# Patient Record
Sex: Female | Born: 1970 | Race: Black or African American | Hispanic: No | Marital: Married | Smoking: Current every day smoker
Health system: Southern US, Community
[De-identification: ages and names within clinical notes are randomized; demographics above are authoritative.]

---

## 2006-04-23 ENCOUNTER — Emergency Department (HOSPITAL_COMMUNITY): Admission: EM | Admit: 2006-04-23 | Discharge: 2006-04-23 | Payer: Self-pay | Admitting: Emergency Medicine

## 2008-06-21 IMAGING — US US PELVIS COMPLETE MODIFY
1 series · 14 of 25 positions shown · non-contrast
Comparison: None.

CLINICAL DATA: Abortion 2 days ago.  Abdominal pain.  Evaluate for retained products of conception. 
 TRANSABDOMINAL AND TRANSVAGINAL PELVIC ULTRASOUND:
TECHNIQUE: Both transabdominal and transvaginal ultrasound examinations of the pelvis were performed including evaluation of the uterus, ovaries, adnexal regions, and pelvic cul-de-sac.

[Series 1: unknown · 0.27mm/px · 14 of 53 slices shown]
[im 1/53]
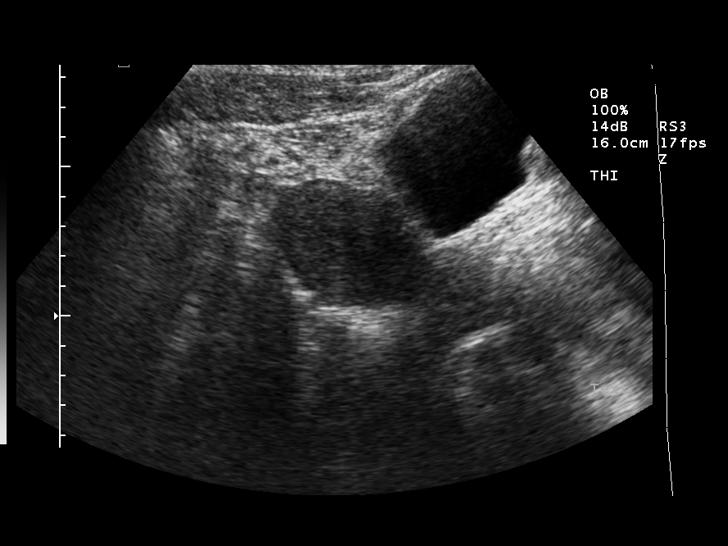
[im 5/53]
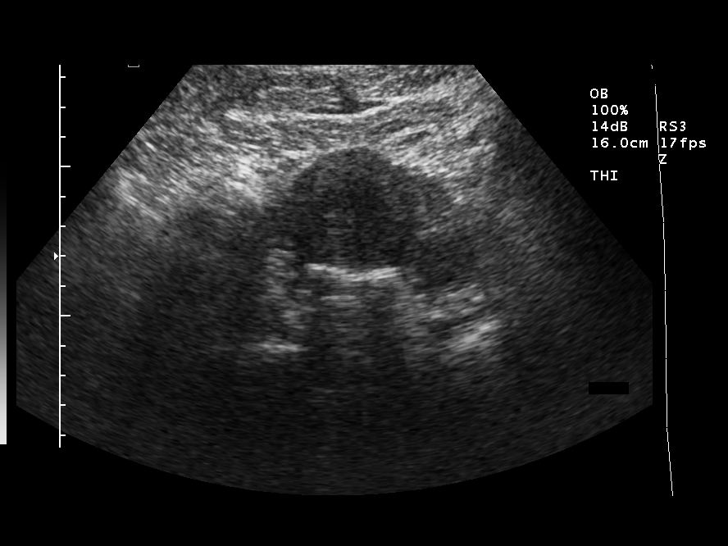
[im 9/53]
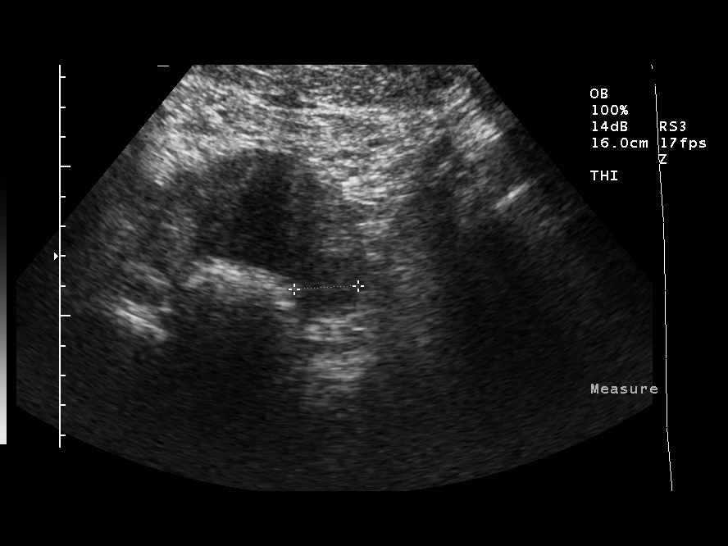
[im 14/53]
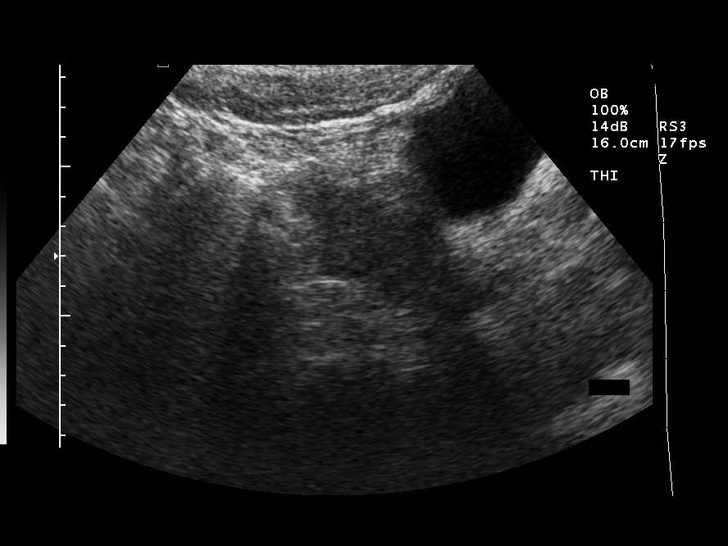
[im 18/53]
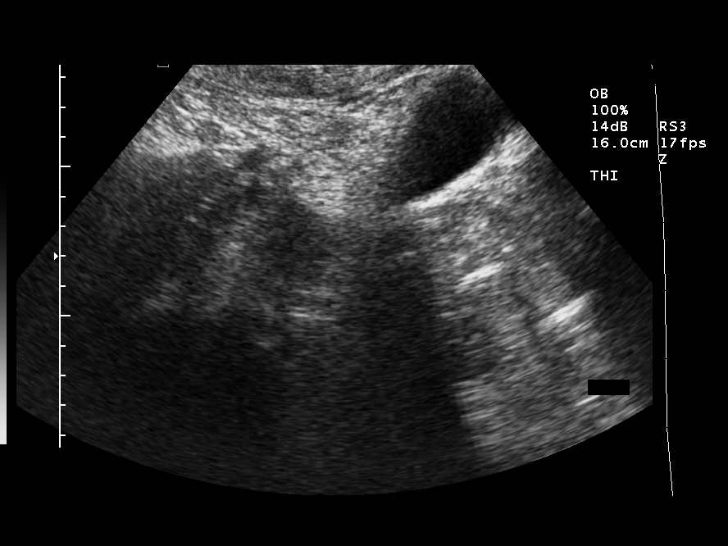
[im 20/53]
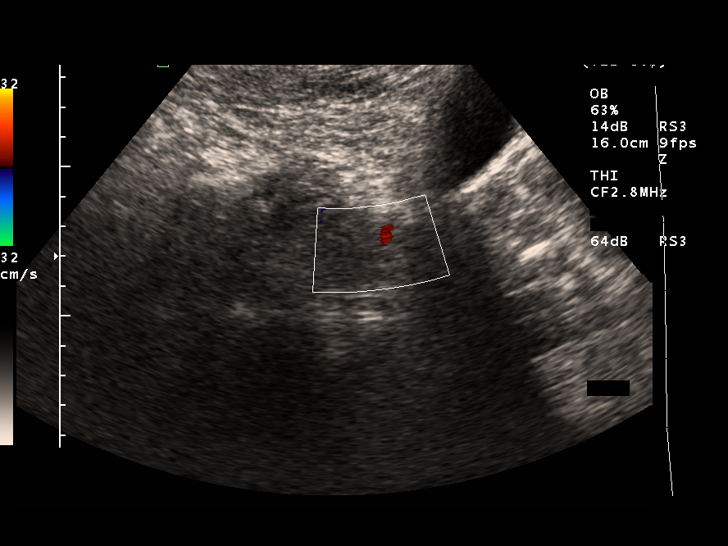
[im 24/53]
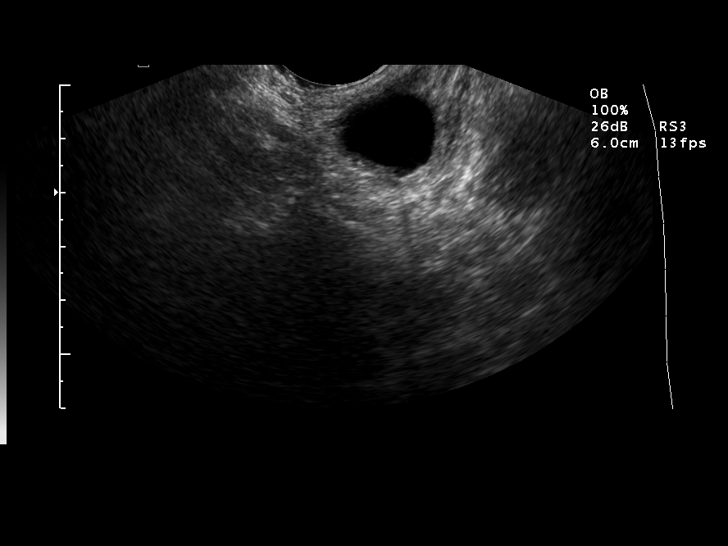
[im 29/53]
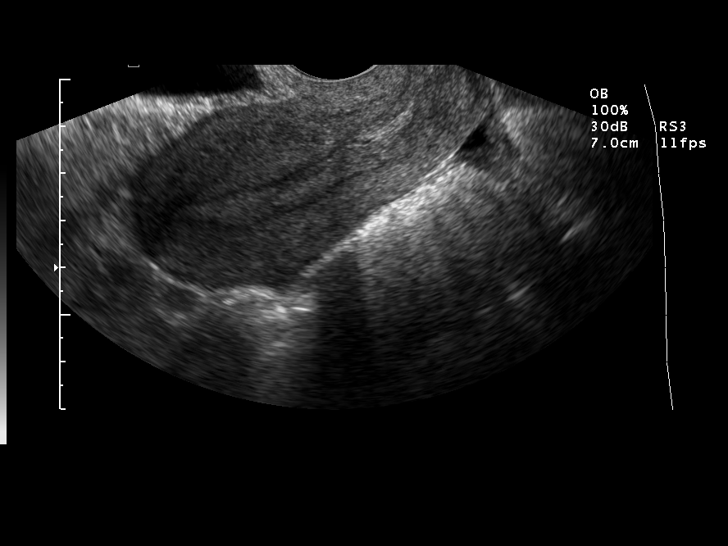
[im 33/53]
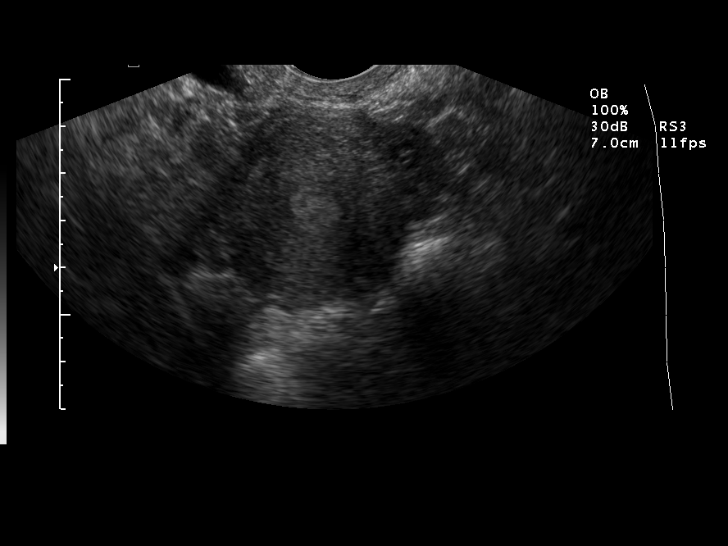
[im 35/53]
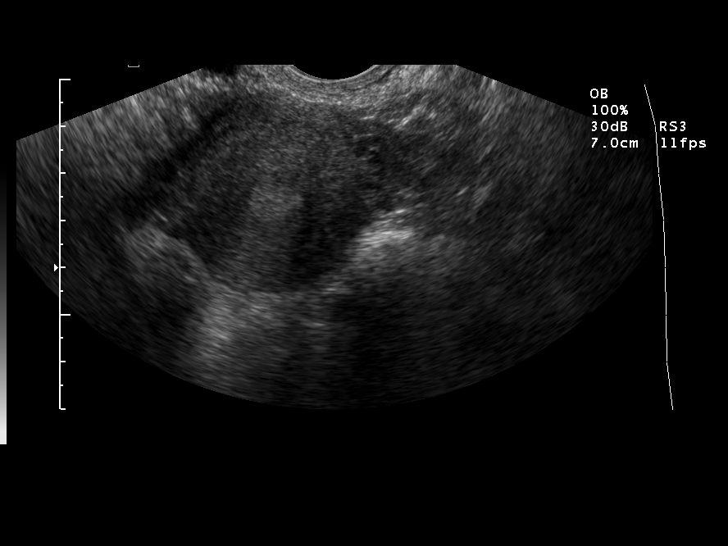
[im 40/53]
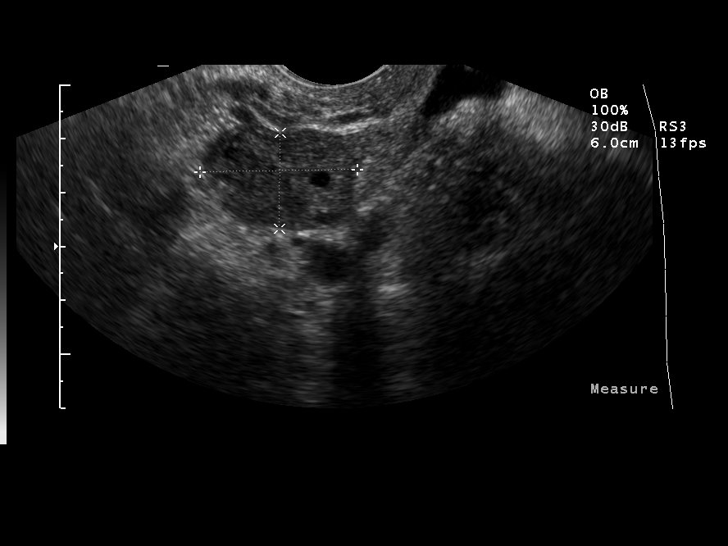
[im 44/53]
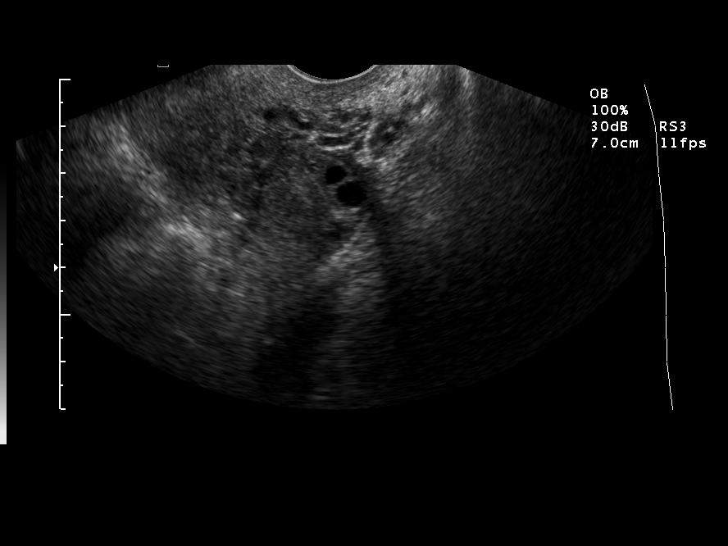
[im 48/53]
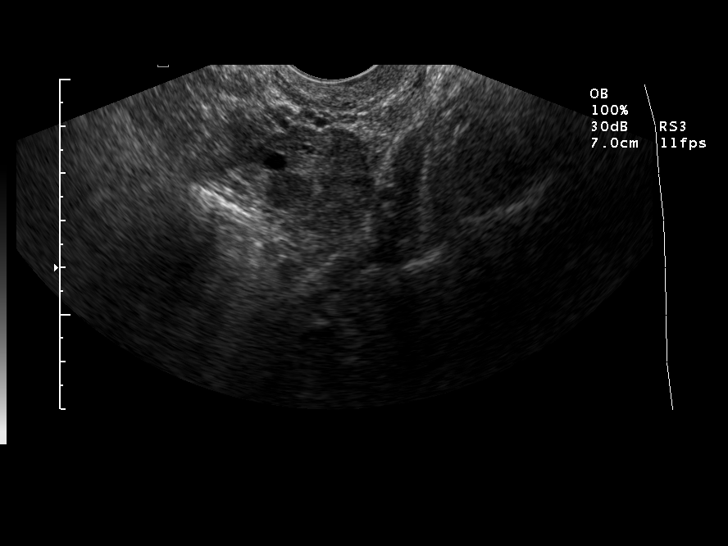
[im 53/53]
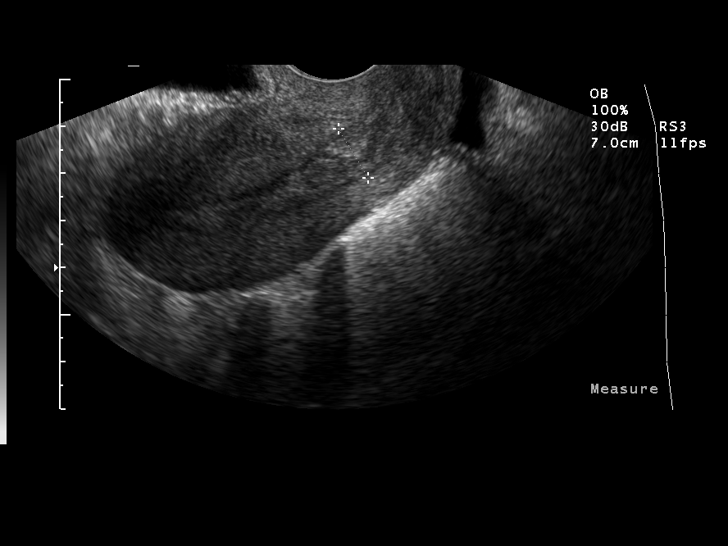

[14 of 25 positions shown; findings below may reference images not displayed]

FINDINGS: Uterus is 8.1 x 3.9 x 3.8 cm.  There is smooth endometrial thickening likely related to blood products.  This measures between 7 mm near the fundus and 12 mm near the lower uterine segment.  There is no specific evidence to suggest retained products of conception. 
 The right ovary is 2.9 x 1.8 x 1.8 cm.  The left ovary is 2.5 x 2.2 x 2.4 cm.  Both ovaries are normal morphology. 
 There are probable blood clots within the vagina.
IMPRESSION: No evidence of retained products of conception.

## 2012-10-21 DIAGNOSIS — C50919 Malignant neoplasm of unspecified site of unspecified female breast: Secondary | ICD-10-CM

## 2012-10-21 DIAGNOSIS — C50412 Malignant neoplasm of upper-outer quadrant of left female breast: Secondary | ICD-10-CM | POA: Insufficient documentation

## 2012-10-21 HISTORY — DX: Malignant neoplasm of unspecified site of unspecified female breast: C50.919

## 2012-12-02 ENCOUNTER — Other Ambulatory Visit: Payer: Self-pay | Admitting: Surgery

## 2012-12-02 DIAGNOSIS — R928 Other abnormal and inconclusive findings on diagnostic imaging of breast: Secondary | ICD-10-CM

## 2012-12-09 ENCOUNTER — Inpatient Hospital Stay: Admission: RE | Admit: 2012-12-09 | Payer: Self-pay | Source: Ambulatory Visit

## 2012-12-15 ENCOUNTER — Inpatient Hospital Stay: Admission: RE | Admit: 2012-12-15 | Payer: Self-pay | Source: Ambulatory Visit

## 2012-12-24 ENCOUNTER — Other Ambulatory Visit: Payer: Self-pay

## 2012-12-28 ENCOUNTER — Ambulatory Visit
Admission: RE | Admit: 2012-12-28 | Discharge: 2012-12-28 | Disposition: A | Discharge status: Home / Self Care | Payer: Medicaid Other | Source: Ambulatory Visit | Attending: Surgery | Admitting: Surgery

## 2012-12-28 ENCOUNTER — Other Ambulatory Visit: Payer: Self-pay | Admitting: Surgery

## 2012-12-28 ENCOUNTER — Ambulatory Visit: Payer: Self-pay

## 2012-12-28 DIAGNOSIS — R928 Other abnormal and inconclusive findings on diagnostic imaging of breast: Secondary | ICD-10-CM

## 2012-12-28 MED ORDER — GADOBENATE DIMEGLUMINE 529 MG/ML IV SOLN
18.0000 mL | Freq: Once | INTRAVENOUS | Status: AC | PRN
  Administered 2012-12-28: 18 mL via INTRAVENOUS

## 2013-05-16 HISTORY — PX: MASTECTOMY: SHX3

## 2015-07-11 DIAGNOSIS — C779 Secondary and unspecified malignant neoplasm of lymph node, unspecified: Secondary | ICD-10-CM | POA: Diagnosis not present

## 2015-07-11 DIAGNOSIS — R599 Enlarged lymph nodes, unspecified: Secondary | ICD-10-CM | POA: Diagnosis not present

## 2015-07-11 DIAGNOSIS — C50912 Malignant neoplasm of unspecified site of left female breast: Secondary | ICD-10-CM | POA: Diagnosis not present

## 2015-07-11 DIAGNOSIS — G609 Hereditary and idiopathic neuropathy, unspecified: Secondary | ICD-10-CM | POA: Diagnosis not present

## 2015-07-31 DIAGNOSIS — E8989 Other postprocedural endocrine and metabolic complications and disorders: Secondary | ICD-10-CM | POA: Insufficient documentation

## 2016-06-03 DIAGNOSIS — Z9012 Acquired absence of left breast and nipple: Secondary | ICD-10-CM | POA: Diagnosis not present

## 2016-06-03 DIAGNOSIS — Z170 Estrogen receptor positive status [ER+]: Secondary | ICD-10-CM | POA: Diagnosis not present

## 2016-06-03 DIAGNOSIS — Z923 Personal history of irradiation: Secondary | ICD-10-CM

## 2016-06-03 DIAGNOSIS — C773 Secondary and unspecified malignant neoplasm of axilla and upper limb lymph nodes: Secondary | ICD-10-CM | POA: Diagnosis not present

## 2016-06-03 DIAGNOSIS — G894 Chronic pain syndrome: Secondary | ICD-10-CM

## 2016-06-03 DIAGNOSIS — C50912 Malignant neoplasm of unspecified site of left female breast: Secondary | ICD-10-CM | POA: Diagnosis not present

## 2016-06-03 DIAGNOSIS — Z79810 Long term (current) use of selective estrogen receptor modulators (SERMs): Secondary | ICD-10-CM

## 2016-06-03 DIAGNOSIS — Z9221 Personal history of antineoplastic chemotherapy: Secondary | ICD-10-CM

## 2016-06-03 DIAGNOSIS — G629 Polyneuropathy, unspecified: Secondary | ICD-10-CM

## 2016-06-03 DIAGNOSIS — I890 Lymphedema, not elsewhere classified: Secondary | ICD-10-CM | POA: Diagnosis not present

## 2016-09-18 DIAGNOSIS — Z170 Estrogen receptor positive status [ER+]: Secondary | ICD-10-CM | POA: Diagnosis not present

## 2016-09-18 DIAGNOSIS — L02422 Furuncle of left axilla: Secondary | ICD-10-CM | POA: Diagnosis not present

## 2016-09-18 DIAGNOSIS — M25532 Pain in left wrist: Secondary | ICD-10-CM | POA: Diagnosis not present

## 2016-09-18 DIAGNOSIS — F419 Anxiety disorder, unspecified: Secondary | ICD-10-CM | POA: Diagnosis not present

## 2016-09-18 DIAGNOSIS — I890 Lymphedema, not elsewhere classified: Secondary | ICD-10-CM | POA: Diagnosis not present

## 2016-09-18 DIAGNOSIS — Z9221 Personal history of antineoplastic chemotherapy: Secondary | ICD-10-CM | POA: Diagnosis not present

## 2016-09-18 DIAGNOSIS — G8928 Other chronic postprocedural pain: Secondary | ICD-10-CM | POA: Diagnosis not present

## 2016-09-18 DIAGNOSIS — Z923 Personal history of irradiation: Secondary | ICD-10-CM | POA: Diagnosis not present

## 2016-09-18 DIAGNOSIS — Z79810 Long term (current) use of selective estrogen receptor modulators (SERMs): Secondary | ICD-10-CM | POA: Diagnosis not present

## 2016-09-18 DIAGNOSIS — C50912 Malignant neoplasm of unspecified site of left female breast: Secondary | ICD-10-CM | POA: Diagnosis not present

## 2016-09-18 DIAGNOSIS — H6692 Otitis media, unspecified, left ear: Secondary | ICD-10-CM | POA: Diagnosis not present

## 2017-12-30 DIAGNOSIS — C50912 Malignant neoplasm of unspecified site of left female breast: Secondary | ICD-10-CM | POA: Diagnosis not present

## 2017-12-30 DIAGNOSIS — Z79810 Long term (current) use of selective estrogen receptor modulators (SERMs): Secondary | ICD-10-CM | POA: Diagnosis not present

## 2019-10-07 DIAGNOSIS — Z5189 Encounter for other specified aftercare: Secondary | ICD-10-CM

## 2019-10-07 DIAGNOSIS — C50912 Malignant neoplasm of unspecified site of left female breast: Secondary | ICD-10-CM | POA: Diagnosis not present

## 2020-07-27 ENCOUNTER — Telehealth: Payer: Self-pay

## 2020-07-27 ENCOUNTER — Other Ambulatory Visit: Payer: Self-pay

## 2020-07-27 DIAGNOSIS — C50912 Malignant neoplasm of unspecified site of left female breast: Secondary | ICD-10-CM

## 2020-07-27 DIAGNOSIS — Z170 Estrogen receptor positive status [ER+]: Secondary | ICD-10-CM

## 2020-07-27 NOTE — Telephone Encounter (Signed)
Appt given for next week.

## 2020-07-27 NOTE — Telephone Encounter (Signed)
-----   Message from Marvia Pickles, PA-C sent at 07/27/2020  4:56 PM EDT ----- Regarding: RE: Pain Left breast & axilla;  orders in EPIC Contact: [phone] I can see her next week put probably due to fibrocystic disease documented by Dr. Hinton Rao. I sent in refill. Thanks! ----- Message ----- From: Dairl Ponder, RN Sent: 07/27/2020  11:50 AM EDT To: Marvia Pickles, PA-C Subject: Pain Left breast & axilla;  orders in EPIC     Pt came in to clinic earlier, to ask about her next appt's. It looks like there are appt's pending in June 2022 in [place] for CT, mammo, and f/u (but not in Epic). Pt wondering if she needs to have her mammogram sooner because of the pain in breast & xilla? She also needs refill of her hormonal tx, which I will set up for you to sign.

## 2020-07-27 NOTE — Telephone Encounter (Signed)
Kelli agreeable to see pt next week. I spoke with pt, gave appt for Tues, 07/31/20.     Pain Left breast & axilla; orders in EPIC Received: Today Dairl Ponder, RN  Marvia Pickles, PA-C Phone Number: [phone]   Pt came in to clinic earlier, to ask about her next appt's. It looks like there are appt's pending in June 2022 in [place] for CT, mammo, and f/u (but not in Epic). Pt wondering if she needs to have her mammogram sooner because of the pain in breast & axilla? She also needs refill of her hormonal tx, which I will set up for you to sign.

## 2020-07-30 ENCOUNTER — Telehealth: Payer: Self-pay

## 2020-07-30 NOTE — Telephone Encounter (Signed)
Verbal order refill for Tamoxifen 20 mg po qd qty 90 tabs 3 refills called to [facility] per Dr Hinton Rao.

## 2020-07-31 ENCOUNTER — Other Ambulatory Visit: Payer: Self-pay

## 2020-07-31 ENCOUNTER — Encounter: Payer: Self-pay | Admitting: Hematology and Oncology

## 2020-07-31 ENCOUNTER — Inpatient Hospital Stay: Payer: Medicaid Other | Attending: Hematology and Oncology | Admitting: Hematology and Oncology

## 2020-07-31 ENCOUNTER — Inpatient Hospital Stay: Payer: Medicaid Other

## 2020-07-31 DIAGNOSIS — C50412 Malignant neoplasm of upper-outer quadrant of left female breast: Secondary | ICD-10-CM

## 2020-07-31 DIAGNOSIS — Z170 Estrogen receptor positive status [ER+]: Secondary | ICD-10-CM | POA: Diagnosis not present

## 2020-07-31 DIAGNOSIS — F32A Depression, unspecified: Secondary | ICD-10-CM

## 2020-07-31 DIAGNOSIS — R059 Cough, unspecified: Secondary | ICD-10-CM | POA: Diagnosis not present

## 2020-07-31 HISTORY — DX: Depression, unspecified: F32.A

## 2020-07-31 LAB — CBC AND DIFFERENTIAL
HCT: 42 (ref 36–46)
Hemoglobin: 14.4 (ref 12.0–16.0)
Neutrophils Absolute: 3.34
Platelets: 376 (ref 150–399)
WBC: 7.1

## 2020-07-31 LAB — CBC
MCV: 92 (ref 81–99)
RBC: 4.57 (ref 3.87–5.11)

## 2020-07-31 LAB — BASIC METABOLIC PANEL
BUN: 9 (ref 4–21)
CO2: 22 (ref 13–22)
Chloride: 107 (ref 99–108)
Creatinine: 0.6 (ref 0.5–1.1)
Glucose: 133
Potassium: 3.9 (ref 3.4–5.3)
Sodium: 136 — AB (ref 137–147)

## 2020-07-31 LAB — HEPATIC FUNCTION PANEL
ALT: 23 (ref 7–35)
AST: 19 (ref 13–35)
Alkaline Phosphatase: 91 (ref 25–125)
Bilirubin, Total: 0.2

## 2020-07-31 LAB — COMPREHENSIVE METABOLIC PANEL
Albumin: 4 (ref 3.5–5.0)
Calcium: 8.6 — AB (ref 8.7–10.7)

## 2020-07-31 MED ORDER — VENLAFAXINE HCL ER 75 MG PO CP24: 75.0000 mg | ORAL_CAPSULE | Freq: Every day | ORAL | 0 refills | Status: DC

## 2020-07-31 NOTE — Progress Notes (Signed)
[facility]  [address] [place]  [place]  [address] [phone]  Clinic Day:  07/31/2020  Referring physician: Lillard Anes,*   CHIEF COMPLAINT:  CC: Stage IIIB hormone receptor positive breast cancer with left breast pain  Current Treatment:  Observation   HISTORY OF PRESENT ILLNESS:  Jaclyn Cooper is a 50 y.o. female with a history of stage IIIA (T3 N1NOS M0) hormone receptor positive left breast cancer diagnosed in June 2014.  She presented with an 8.6 cm lesion of the left breast, as well as a 4.5 cm left axillary node.  Biopsies revealed grade 3, invasive ductal carcinoma of the breast, with metastasis to the lymph node.  Estrogen and progesterone receptors were positive and HER 2 Neu negative.  Ki 67 was 17%.  She received neoadjuvant chemotherapy consisting of 6 cycles of docetaxel, doxorubicin, and cyclophosphamide, which was completed in November 2014.  She underwent left mastectomy in January 2015.  Pathology revealed a residual 2 cm invasive ductal carcinoma with a focally positive posterior margin.  Metastasis was seen in 1/6 lymph nodes.  She received adjuvant radiation to the left chest wall and axilla completed in March 2015.  She was placed on tamoxifen in April 2015.  She developed lymphedema of her left arm, for which she received occupational therapy with improvement.  She also had cognitive changes associated with chemotherapy, for which she underwent treatment with the speech therapist.  In May 2015, there was a concern about a possible nodule in the left axilla, but ultrasound was negative.  In March 2017, she had a small left inguinal node, ultrasound revealed a prominent, but normal appearing left inguinal node.    She has had chronic pain in the left mastectomy site, which had been managed with gabapentin and duloxetine.  Right screening mammogram in May 2019 did not reveal any evidence of malignancy.  She was lost  to follow-up from November 2018 until September 2019 and had been out of her tamoxifen.  At her visit in September, she continued to have multiple complaints. Duloxetine had been discontinued and she had tizanidine in addition to the gabapentin.  She requested a refill of all her medications, but we had asked that her primary care provider manage her medications. We did refill her gabapentin 300 mg twice daily at that time but asked her to follow-up with her primary care provider as well.  We recommend that she receive a total of 10 years of adjuvant hormonal therapy.  Right screening mammogram in June 2020 did not reveal any evidence of malignancy.  At her visit in June 2021, she reported persistent pain of the right breast and left chest wall.  She has fibrocystic changes of the right breast, so was advised to cut back on her caffeine intake and take vitamin E 400 IU once daily. She continued to chronic pain in the left chest wall and axilla without evidence of recurrence.  We really have been trying to be hands off on her medications, as we only see her yearly.      At that time, she also reported chronic cough and dyspnea and is a heavy smoker, so we recommended a CT chest. She agreed and this was scheduled, but apparently she did not go to the appointment.  INTERVAL HISTORY:  Jaclyn Cooper is added to the schedule today as she telephones reporting pain in the left breast and axilla.  She denies any changes in her right breast. She states  that Tylenol and Advil are ineffective for the pain.  She has not been taking gabapentin, as she has been out of it. She feels that the gabapentin was not very effective for the pain in the past. Unfortunately, she has not been taking her tamoxifen for about 10 months.  She attributes this to depression after losing her mother.  She has not seen her primary care provider or a counselor regarding her depression. She did not go for the CT of the chest last year due to her mother  being ill.  She reports continued cough and intermittent dyspnea but denies chest pain. She denies fevers or chills. She denies pain. Her appetite is good. Her weight has been stable. Unfortunately, she continues to smoke about a pack per day.  REVIEW OF SYSTEMS:  Review of Systems  Constitutional: Negative for appetite change, chills, fatigue, fever and unexpected weight change.  HENT:   Negative for lump/mass, mouth sores and sore throat.   Respiratory: Positive for cough and shortness of breath.   Cardiovascular: Negative for chest pain and leg swelling.  Gastrointestinal: Negative for abdominal pain, constipation, diarrhea, nausea and vomiting.  Endocrine: Negative for hot flashes.  Genitourinary: Negative for difficulty urinating, dysuria, frequency and hematuria.   Musculoskeletal: Positive for myalgias (left axilla). Negative for arthralgias and back pain.  Skin: Negative for rash.  Neurological: Negative for dizziness and headaches.  Hematological: Negative for adenopathy. Does not bruise/bleed easily.  Psychiatric/Behavioral: Negative for depression and sleep disturbance. The patient is not nervous/anxious.      VITALS:  Blood pressure 128/83, pulse 82, temperature 98.3 F (36.8 C), temperature source Oral, resp. rate 20, height 5\' 1"  (1.549 m), weight 195 lb 14.4 oz (88.9 kg), SpO2 98 %.  Wt Readings from Last 3 Encounters:  07/31/20 195 lb 14.4 oz (88.9 kg)    Body mass index is 37.01 kg/m.  Performance status (ECOG): 1 - Symptomatic but completely ambulatory  PHYSICAL EXAM:  Physical Exam Vitals and nursing note reviewed.  Constitutional:      General: She is not in acute distress.    Appearance: Normal appearance.  HENT:     Head: Normocephalic and atraumatic.     Mouth/Throat:     Mouth: Mucous membranes are moist.     Pharynx: Oropharynx is clear. No oropharyngeal exudate or posterior oropharyngeal erythema.  Eyes:     General: No scleral icterus.     Extraocular Movements: Extraocular movements intact.     Conjunctiva/sclera: Conjunctivae normal.     Pupils: Pupils are equal, round, and reactive to light.  Cardiovascular:     Rate and Rhythm: Normal rate and regular rhythm.     Heart sounds: Normal heart sounds. No murmur heard. No friction rub. No gallop.   Pulmonary:     Effort: Pulmonary effort is normal.     Breath sounds: Normal breath sounds. No wheezing, rhonchi or rales.  Chest:  Breasts:     Right: Normal. No swelling, bleeding, inverted nipple, mass, nipple discharge, skin change, tenderness, axillary adenopathy or supraclavicular adenopathy.     Left: Absent. No axillary adenopathy or supraclavicular adenopathy.      Comments:   Left chest wall is negative.  She has persistent tenderness in the left axilla Abdominal:     General: There is no distension.     Palpations: Abdomen is soft. There is no hepatomegaly, splenomegaly or mass.     Tenderness: There is no abdominal tenderness.  Musculoskeletal:  General: Normal range of motion.     Cervical back: Normal range of motion and neck supple. No tenderness.     Right lower leg: No edema.     Left lower leg: No edema.  Lymphadenopathy:     Cervical: No cervical adenopathy.     Upper Body:     Right upper body: No supraclavicular or axillary adenopathy.     Left upper body: No supraclavicular or axillary adenopathy.     Lower Body: No right inguinal adenopathy. No left inguinal adenopathy.  Skin:    General: Skin is warm and dry.     Coloration: Skin is not jaundiced.     Findings: No rash.  Neurological:     Mental Status: She is alert and oriented to person, place, and time.     Cranial Nerves: No cranial nerve deficit.  Psychiatric:        Attention and Perception: Attention and perception normal.        Mood and Affect: Mood is depressed. Affect is tearful.        Speech: Speech normal.        Behavior: Behavior normal.        Thought Content: Thought  content normal.        Cognition and Memory: Cognition and memory normal.        Judgment: Judgment normal.    LABS:   CBC Latest Ref Rng & Units 07/31/2020  WBC - 7.1  Hemoglobin 12.0 - 16.0 14.4  Hematocrit 36 - 46 42  Platelets 150 - 399 376   CMP Latest Ref Rng & Units 07/31/2020  BUN 4 - 21 9  Creatinine 0.5 - 1.1 0.6  Sodium 137 - 147 136(A)  Potassium 3.4 - 5.3 3.9  Chloride 99 - 108 107  CO2 13 - 22 22  Calcium 8.7 - 10.7 8.6(A)  Alkaline Phos 25 - 125 91  AST 13 - 35 19  ALT 7 - 35 23     No results found for: CEA1 / No results found for: CEA1 No results found for: PSA1 No results found for: JJO841 No results found for: CAN125  No results found for: TOTALPROTELP, ALBUMINELP, A1GS, A2GS, BETS, BETA2SER, GAMS, MSPIKE, SPEI No results found for: TIBC, FERRITIN, IRONPCTSAT No results found for: LDH  STUDIES:  No results found.    HISTORY:   Past Medical History:  Diagnosis Date  . Breast cancer ([facility]) 10/21/2012  . Depression 07/31/2020    Past Surgical History:  Procedure Laterality Date  . MASTECTOMY Left 05/16/2013    History reviewed. No pertinent family history.  Social History:  reports that she has been smoking cigarettes. She has been smoking about 1.00 pack per day. She has never used smokeless tobacco. No history on file for alcohol use and drug use.The patient is alone today.  Allergies: Not on File  Current Medications: Current Outpatient Medications  Medication Sig Dispense Refill  . venlafaxine XR (EFFEXOR-XR) 75 MG 24 hr capsule Take 1 capsule (75 mg total) by mouth daily with breakfast. 30 capsule 0  . tamoxifen (NOLVADEX) 20 MG tablet Take 1 tablet by mouth daily.     No current facility-administered medications for this visit.     ASSESSMENT & PLAN:   Assessment/Plan:  1. Stage III A hormone receptor positive breast cancer treated with left mastectomy adjuvant chemotherapy and hormonal therapy with tamoxifen. She remains without  evidence of recurrence. We have recommended a total of 10 years of  tamoxifen.  Unfortunately, she has been off of tamoxifen for about 10 months. She resumed that today. I advised her of the importance of continuing tamoxifen daily to prevent recurrent breast cancer. 2. Left axillary tenderness without palpable abnormality, which is chronic.  As the gabapentin did not seem very effective, I would not recommend continuing that and prefer she continue ibuprofen. 3. Cough and dyspnea.  The patient was recommended for CT chest last year and did not have that done.  I will obtain a chest x-ray today and if it is nondiagnostic, plan to proceed with a CT chest. 4. Tobacco abuse, once again I advised her of the importance of complete abstinence from tobacco.  I gave her information regarding the [place] Quit Line and encouraged her to also use nicotine replacement to help with smoking cessation. 5. Depression, I wanted her to see her primary care provider to manage this, as we are only seeing her on a yearly basis, but we contacted her PCP's office and the patient will no longer be seen there due to multiple no shows.  I will place the patient on venlafaxine XR 75 mg daily.  She will establish with a new primary care provider within the next 30 days to continue management of her depression.  If she has difficulty with the venlafaxine she will contact us.   We will plan to see her back in 1 year with a right screening mammogram.The patient understands the plans discussed today and is in agreement with them.  She knows to contact our office if she develops concerns prior to her next appointment.   I provided 30 minutes of face-to-face time during this this encounter and > 50% was spent counseling as documented under my assessment and plan.    Marvia Pickles, PA-C

## 2020-08-31 ENCOUNTER — Other Ambulatory Visit: Payer: Self-pay | Admitting: Hematology and Oncology

## 2020-08-31 NOTE — Telephone Encounter (Signed)
08/31/20 @ 926am, I spoke with Katharine Look @ [facility]. I notified her that we prefer that PCP refill the Effexor if appropriate. Katharine Look stated that pt saw Chelsea Poe,NP, yesterday. She wrote down the medication and pharmacy request, & will give the [place], to address.    I spoke with pt to see if she had established an appt with new PCP. It is recommended that pt get her daily medications by her PCP. Pt states she was actually seen yesterday @ [facility] (she had been long standing pt there in past, but had several no shows- so they wouldn't R/S in past). I notified pt that we would send refill request to PCP.    We received a refill request for Effexor, that she be sent to her PCP.

## 2020-11-30 ENCOUNTER — Encounter: Payer: Self-pay | Admitting: Hematology and Oncology

## 2021-02-06 ENCOUNTER — Other Ambulatory Visit: Payer: Self-pay | Admitting: Hematology and Oncology

## 2021-02-06 ENCOUNTER — Telehealth: Payer: Self-pay

## 2021-02-06 ENCOUNTER — Other Ambulatory Visit: Payer: Self-pay

## 2021-02-06 DIAGNOSIS — Z170 Estrogen receptor positive status [ER+]: Secondary | ICD-10-CM

## 2021-02-06 DIAGNOSIS — C50412 Malignant neoplasm of upper-outer quadrant of left female breast: Secondary | ICD-10-CM

## 2021-02-06 MED ORDER — TAMOXIFEN CITRATE 20 MG PO TABS: 20.0000 mg | ORAL_TABLET | Freq: Every day | ORAL | 3 refills | Status: DC

## 2021-02-06 NOTE — Telephone Encounter (Addendum)
I notified pt's spouse that we have sent in tamoxifen for pt. He will relay the message to Alfred.

## 2021-05-02 ENCOUNTER — Other Ambulatory Visit: Payer: Self-pay | Admitting: Hematology and Oncology

## 2021-05-02 NOTE — Progress Notes (Signed)
Received a letter from her insurance stating she has been filling her tamoxifen daily. She states she has been taking her tamoxifen daily. She understands the importance of doing so. She states she has a bumpy rash on her arms that is tender but not itchy. Recommended she see her PCP to evaluate and might need to see dermatology. The patient verbalizes understanding.

## 2021-07-16 ENCOUNTER — Telehealth: Payer: Self-pay

## 2021-07-16 NOTE — Telephone Encounter (Signed)
I attempted call back to pt to inquire what she needs, no answer. ?

## 2021-07-30 ENCOUNTER — Other Ambulatory Visit: Payer: Self-pay | Admitting: Oncology

## 2021-07-30 DIAGNOSIS — C50412 Malignant neoplasm of upper-outer quadrant of left female breast: Secondary | ICD-10-CM

## 2021-07-30 NOTE — Progress Notes (Signed)
?[facility]  ?[address] ?[place],  [place]  [address] ?[phone] ? ?Clinic Day:07/31/21 ? ?Referring physician: Madison Hickman, FNP ? ? ?CHIEF COMPLAINT:  ?CC: Stage IIIB hormone receptor positive breast cancer with left breast pain ? ?Current Treatment:  Observation ? ? ?HISTORY OF PRESENT ILLNESS:  ?Jaclyn Cooper is a 51 y.o. female with a history of stage IIIA (T3 N1NOS M0) hormone receptor positive left breast cancer diagnosed in June 2014.  She presented with an 8.6 cm lesion of the left breast, as well as a 4.5 cm left axillary node.  Biopsies revealed grade 3, invasive ductal carcinoma of the breast, with metastasis to the lymph node.  Estrogen and progesterone receptors were positive and HER 2 Neu negative.  Ki 67 was 17%.  She received neoadjuvant chemotherapy consisting of 6 cycles of docetaxel, doxorubicin, and cyclophosphamide, which was completed in November 2014.  She underwent left mastectomy in January 2015.  Pathology revealed a residual 2 cm invasive ductal carcinoma with a focally positive posterior margin.  Metastasis was seen in 1/6 lymph nodes.  She received adjuvant radiation to the left chest wall and axilla completed in March 2015.  She was placed on tamoxifen in April 2015.  She developed lymphedema of her left arm, for which she received occupational therapy with improvement.  She also had cognitive changes associated with chemotherapy, for which she underwent treatment with the speech therapist.  In May 2015, there was a concern about a possible nodule in the left axilla, but ultrasound was negative.  In March 2017, she had a small left inguinal node, and ultrasound revealed a prominent, but normal appearing left inguinal node.   ? ?She has had chronic pain in the left mastectomy site, which had been managed with gabapentin and duloxetine. She was lost to follow-up from November 2018 until September 2019 and had been out of her tamoxifen.   Duloxetine had been discontinued and she had tizanidine in addition to the gabapentin.  She requested a refill of all her medications, but we had asked that her primary care provider manage her medications. We did refill her gabapentin 300 mg twice daily at that time but asked her to follow-up with her primary care provider as well.  We recommend that she receive a total of 10 years of adjuvant hormonal therapy, but she has had multiple long interruptions due to non-compliance.  At her visit in June 2021, she reported persistent pain of the right breast and left chest wall.  She has fibrocystic changes of the right breast, so was advised to cut back on her caffeine intake and take vitamin E 400 IU once daily. She continued to chronic pain in the left chest wall and axilla without evidence of recurrence.  We really have been trying to be hands off on her medications, as we only see her yearly.  She is a smoker but has declined CT chest in past. ?   ?INTERVAL HISTORY:  ?Jaclyn Cooper is here for follow up of her stage IIIA left breast cancer. She denies any changes in her right breast. Overall she is doing well but complains of a raised area of her left buccal mucosa. Her annual right mammogram was done in August and was clear.  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 She has some chronic non-productive cough. She is requesting refills of tamoxifen and venlafaxine, and is out of gabapentin. She denies nausea, vomiting, abdominal pain or bowel problems. She denies  fevers or chills. She denies pain. Her appetite is up and down and she has gained nearly 2 pounds. CBC and CMP are unremarkable with a platelet count of 427,000 and non fasting BS of 125. Unfortunately, she continues to smoke but says less than a pack per day.She has been smoking for over 30 years. I think she is a candidate for low dose lung cancer screening CT and we discussed the risks and benefits. ? ?REVIEW OF SYSTEMS:  ?Review of Systems  ?Constitutional:   Negative for appetite change, chills, fatigue, fever and unexpected weight change.  ?HENT:   Negative for lump/mass, mouth sores and sore throat.   ?     She has a lesion of the left buccal mucosa that has persisted for "a long time"  ?Cardiovascular:  Negative for chest pain and leg swelling.  ?Gastrointestinal:  Negative for abdominal pain, constipation, diarrhea, nausea and vomiting.  ?Endocrine: Negative for hot flashes.  ?Genitourinary:  Negative for difficulty urinating, dysuria, frequency and hematuria.   ?Musculoskeletal:  Negative for arthralgias and back pain. Myalgias: left axilla. ?Skin:  Negative for rash.  ?Neurological:  Negative for dizziness and headaches.  ?Hematological:  Negative for adenopathy. Does not bruise/bleed easily.  ?Psychiatric/Behavioral:  Negative for depression and sleep disturbance. The patient is not nervous/anxious.   ?All other systems reviewed and are negative.  ? ?VITALS:  ?Blood pressure 125/83, pulse 66, temperature 97.7 ?F (36.5 ?C), temperature source Oral, resp. rate 18, height '5\' 1"'$  (1.549 m), weight 196 lb 12.8 oz (89.3 kg), SpO2 99 %.  ?Wt Readings from Last 3 Encounters:  ?07/31/21 196 lb 12.8 oz (89.3 kg)  ?07/31/20 195 lb 14.4 oz (88.9 kg)  ?  ?Body mass index is 37.19 kg/m?. ? ?Performance status (ECOG): 1 - Symptomatic but completely ambulatory ? ?PHYSICAL EXAM:  ?Physical Exam ?Vitals and nursing note reviewed.  ?Constitutional:   ?   General: She is not in acute distress. ?   Appearance: Normal appearance.  ?HENT:  ?   Head: Normocephalic and atraumatic.  ?   Mouth/Throat:  ?   Mouth: Mucous membranes are moist.  ?   Pharynx: Oropharynx is clear. No oropharyngeal exudate or posterior oropharyngeal erythema.  ?   Comments: She has a raised white lesion, at least 1 cm in diameter, of the left buccal mucosa ?I am not sure if this is leukoplakia. It is non-tender to palpation ?Eyes:  ?   General: No scleral icterus. ?   Extraocular Movements: Extraocular movements  intact.  ?   Conjunctiva/sclera: Conjunctivae normal.  ?   Pupils: Pupils are equal, round, and reactive to light.  ?Cardiovascular:  ?   Rate and Rhythm: Normal rate and regular rhythm.  ?   Heart sounds: Normal heart sounds. No murmur heard. ?  No friction rub. No gallop.  ?Pulmonary:  ?   Effort: Pulmonary effort is normal.  ?   Breath sounds: Normal breath sounds. No wheezing, rhonchi or rales.  ?Chest:  ?Breasts: ?   Right: Normal. No swelling, bleeding, inverted nipple, mass, nipple discharge, skin change or tenderness.  ?   Left: Absent.  ?   Comments:   Left chest wall is negative.   ?She has severe fibrocystic changes of the right breast that are quite tender but feel benign ?Abdominal:  ?   General: There is no distension.  ?   Palpations: Abdomen is soft. There is no hepatomegaly, splenomegaly or mass.  ?   Tenderness: There is no  abdominal tenderness.  ?Musculoskeletal:     ?   General: Normal range of motion.  ?   Cervical back: Normal range of motion and neck supple. No tenderness.  ?   Right lower leg: No edema.  ?   Left lower leg: No edema.  ?Lymphadenopathy:  ?   Cervical: No cervical adenopathy.  ?   Upper Body:  ?   Right upper body: No supraclavicular or axillary adenopathy.  ?   Left upper body: No supraclavicular or axillary adenopathy.  ?   Lower Body: No right inguinal adenopathy. No left inguinal adenopathy.  ?Skin: ?   General: Skin is warm and dry.  ?   Coloration: Skin is not jaundiced.  ?   Findings: No rash.  ?Neurological:  ?   Mental Status: She is alert and oriented to person, place, and time.  ?   Cranial Nerves: No cranial nerve deficit.  ?Psychiatric:     ?   Attention and Perception: Attention and perception normal.     ?   Mood and Affect: Mood is depressed. Affect is tearful.     ?   Speech: Speech normal.     ?   Behavior: Behavior normal.     ?   Thought Content: Thought content normal.     ?   Cognition and Memory: Cognition and memory normal.     ?   Judgment: Judgment  normal.  ? ?LABS:  ? ? ?  Latest Ref Rng & Units 07/31/2021  ? 12:00 AM 07/31/2020  ? 12:00 AM  ?CBC  ?WBC  7.3      7.1       ?Hemoglobin 12.0 - 16.0 13.9      14.4       ?Hematocrit 36 - 46 43      42       ?Plat

## 2021-07-31 ENCOUNTER — Inpatient Hospital Stay: Payer: Medicaid Other

## 2021-07-31 ENCOUNTER — Other Ambulatory Visit: Payer: Self-pay | Admitting: Oncology

## 2021-07-31 ENCOUNTER — Inpatient Hospital Stay: Payer: Medicaid Other | Attending: Oncology | Admitting: Oncology

## 2021-07-31 ENCOUNTER — Encounter: Payer: Self-pay | Admitting: Oncology

## 2021-07-31 VITALS — BP 125/83 | HR 66 | Temp 97.7°F | Resp 18 | Ht 61.0 in | Wt 196.8 lb

## 2021-07-31 DIAGNOSIS — Z170 Estrogen receptor positive status [ER+]: Secondary | ICD-10-CM

## 2021-07-31 DIAGNOSIS — I890 Lymphedema, not elsewhere classified: Secondary | ICD-10-CM

## 2021-07-31 DIAGNOSIS — Z720 Tobacco use: Secondary | ICD-10-CM

## 2021-07-31 DIAGNOSIS — C50412 Malignant neoplasm of upper-outer quadrant of left female breast: Secondary | ICD-10-CM

## 2021-07-31 DIAGNOSIS — E8989 Other postprocedural endocrine and metabolic complications and disorders: Secondary | ICD-10-CM

## 2021-07-31 LAB — BASIC METABOLIC PANEL
BUN: 9 (ref 4–21)
CO2: 21 (ref 13–22)
Chloride: 111 — AB (ref 99–108)
Creatinine: 0.6 (ref 0.5–1.1)
Glucose: 125
Potassium: 4.3 mEq/L (ref 3.5–5.1)
Sodium: 138 (ref 137–147)

## 2021-07-31 LAB — HEPATIC FUNCTION PANEL
ALT: 25 U/L (ref 7–35)
AST: 26 (ref 13–35)
Alkaline Phosphatase: 93 (ref 25–125)
Bilirubin, Total: 0.3

## 2021-07-31 LAB — CBC AND DIFFERENTIAL
HCT: 43 (ref 36–46)
Hemoglobin: 13.9 (ref 12.0–16.0)
Neutrophils Absolute: 3.94
Platelets: 427 10*3/uL — AB (ref 150–400)
WBC: 7.3

## 2021-07-31 LAB — COMPREHENSIVE METABOLIC PANEL
Albumin: 3.7 (ref 3.5–5.0)
Calcium: 8.5 — AB (ref 8.7–10.7)

## 2021-07-31 LAB — CBC: RBC: 4.61 (ref 3.87–5.11)

## 2021-07-31 MED ORDER — GABAPENTIN 300 MG PO CAPS: 300.0000 mg | ORAL_CAPSULE | Freq: Three times a day (TID) | ORAL | 5 refills | Status: DC

## 2021-07-31 MED ORDER — TAMOXIFEN CITRATE 20 MG PO TABS: 20.0000 mg | ORAL_TABLET | Freq: Every day | ORAL | 3 refills | Status: AC

## 2021-07-31 MED ORDER — TAMOXIFEN CITRATE 20 MG PO TABS: 20.0000 mg | ORAL_TABLET | Freq: Every day | ORAL | 3 refills | Status: DC

## 2021-07-31 MED ORDER — VENLAFAXINE HCL ER 75 MG PO CP24: 75.0000 mg | ORAL_CAPSULE | Freq: Every day | ORAL | 3 refills | Status: DC

## 2021-08-01 ENCOUNTER — Telehealth: Payer: Self-pay

## 2021-08-01 NOTE — Telephone Encounter (Signed)
-----   Message from Derwood Kaplan, MD sent at 07/31/2021 12:34 PM EDT ----- ?Regarding: call ?Tell her rest of labs good, incl BS 125 ? ?

## 2021-08-01 NOTE — Telephone Encounter (Signed)
Patient notified

## 2021-08-02 ENCOUNTER — Telehealth: Payer: Self-pay

## 2021-08-02 NOTE — Telephone Encounter (Signed)
-----   Message from Derwood Kaplan, MD sent at 07/31/2021 12:26 PM EDT ----- ?Regarding: refer ?Refer to oral surgeon for lesion of inner left buccal mucosa, poss leukoplakia? In a smoker ? ?

## 2021-08-02 NOTE — Telephone Encounter (Signed)
Referral sent to [facility] in [place] via fax. ?

## 2021-12-08 NOTE — Progress Notes (Signed)
[facility]  [address] [place],  [place]  [address] [phone]  Clinic Day:12/09/21  Referring physician: Madison Hickman, FNP   CHIEF COMPLAINT:  CC: Stage IIIB hormone receptor positive breast cancer with left breast pain  Current Treatment:  Observation   HISTORY OF PRESENT ILLNESS:  Jaclyn Cooper is a 51 y.o. female with a history of stage IIIA (T3 N1NOS M0) hormone receptor positive left breast cancer diagnosed in June 2014.  She presented with an 8.6 cm lesion of the left breast, as well as a 4.5 cm left axillary node.  Biopsies revealed grade 3, invasive ductal carcinoma of the breast, with metastasis to the lymph node.  Estrogen and progesterone receptors were positive and HER 2 Neu negative.  Ki 67 was 17%.  She received neoadjuvant chemotherapy consisting of 6 cycles of docetaxel, doxorubicin, and cyclophosphamide, which was completed in November 2014.  She underwent left mastectomy in January 2015.  Pathology revealed a residual 2 cm invasive ductal carcinoma with a focally positive posterior margin.  Metastasis was seen in 1/6 lymph nodes.  She received adjuvant radiation to the left chest wall and axilla completed in March 2015.  She was placed on tamoxifen in April 2015.  She developed lymphedema of her left arm, for which she received occupational therapy with improvement.  She also had cognitive changes associated with chemotherapy, for which she underwent treatment with the speech therapist.  In May 2015, there was a concern about a possible nodule in the left axilla, but ultrasound was negative.  In March 2017, she had a small left inguinal node, and ultrasound revealed a prominent, but normal appearing left inguinal node.    She has had chronic pain in the left mastectomy site, which had been managed with gabapentin and duloxetine. She was lost to follow-up from November 2018 until September 2019 and had been out of her tamoxifen.   Duloxetine had been discontinued and she had tizanidine in addition to the gabapentin.  She requested a refill of all her medications, but we had asked that her primary care provider manage her medications. We did refill her gabapentin 300 mg twice daily at that time but asked her to follow-up with her primary care provider as well.  We recommend that she receive a total of 10 years of adjuvant hormonal therapy, but she has had multiple long interruptions due to non-compliance.  At her visit in June 2021, she reported persistent pain of the right breast and left chest wall.  She has fibrocystic changes of the right breast, so was advised to cut back on her caffeine intake and take vitamin E 400 IU once daily. She continued to chronic pain in the left chest wall and axilla without evidence of recurrence. She is a smoker but is too young to qualify for low dose lung cancer screening.    INTERVAL HISTORY:  Jaclyn Cooper is here for follow up of her stage IIIA left breast cancer. She denies any changes in her right breast. Overall she is doing well but complains of a raised area of her left buccal mucosa. Her annual right mammogram is due now so I will get that scheduled. She still has neuropathy of her hand, and complains of pain of the right leg at an 8/10.  She is requesting refills of gabapentin. She denies nausea, vomiting, abdominal pain or bowel problems. She denies fevers or chills. She denies pain. Her appetite is up and down and she has  lost nearly 7 pounds. Unfortunately, she continues to smoke but says less than a pack per day.She has been smoking for over 30 years. I think she is a candidate for low dose lung cancer screening CT but she is less than 24 years old.Marland Kitchen  REVIEW OF SYSTEMS:  Review of Systems  Constitutional:  Negative for appetite change, chills, fatigue, fever and unexpected weight change.  HENT:   Negative for lump/mass, mouth sores and sore throat.        She has a lesion of the left buccal  mucosa that has persisted for "a long time"  Cardiovascular:  Negative for chest pain and leg swelling.  Gastrointestinal:  Negative for abdominal pain, constipation, diarrhea, nausea and vomiting.  Endocrine: Negative for hot flashes.  Genitourinary:  Negative for difficulty urinating, dysuria, frequency and hematuria.   Musculoskeletal:  Negative for arthralgias and back pain. Myalgias: left axilla. Skin:  Negative for rash.  Neurological:  Negative for dizziness and headaches.  Hematological:  Negative for adenopathy. Does not bruise/bleed easily.  Psychiatric/Behavioral:  Negative for depression and sleep disturbance. The patient is not nervous/anxious.   All other systems reviewed and are negative.    VITALS:  Blood pressure 118/67, pulse 61, temperature 98.2 F (36.8 C), temperature source Oral, resp. rate 18, height '5\' 1"'$  (1.549 m), weight 187 lb 12.8 oz (85.2 kg), SpO2 97 %.  Wt Readings from Last 3 Encounters:  12/09/21 187 lb 12.8 oz (85.2 kg)  07/31/21 196 lb 12.8 oz (89.3 kg)  07/31/20 195 lb 14.4 oz (88.9 kg)    Body mass index is 35.48 kg/m.  Performance status (ECOG): 1 - Symptomatic but completely ambulatory  PHYSICAL EXAM:  Physical Exam Vitals and nursing note reviewed.  Constitutional:      General: She is not in acute distress.    Appearance: Normal appearance.  HENT:     Head: Normocephalic and atraumatic.     Mouth/Throat:     Mouth: Mucous membranes are moist.     Pharynx: Oropharynx is clear. No oropharyngeal exudate or posterior oropharyngeal erythema.     Comments: She has a raised white lesion, at least 1 cm in diameter, of the left buccal mucosa I am not sure if this is leukoplakia. It is non-tender to palpation Eyes:     General: No scleral icterus.    Extraocular Movements: Extraocular movements intact.     Conjunctiva/sclera: Conjunctivae normal.     Pupils: Pupils are equal, round, and reactive to light.  Cardiovascular:     Rate and  Rhythm: Normal rate and regular rhythm.     Heart sounds: Normal heart sounds. No murmur heard.    No friction rub. No gallop.  Pulmonary:     Effort: Pulmonary effort is normal.     Breath sounds: Normal breath sounds. No wheezing, rhonchi or rales.  Chest:  Breasts:    Right: Normal. No swelling, bleeding, inverted nipple, mass, nipple discharge, skin change or tenderness.     Left: Absent.     Comments:   Left chest wall is negative.   She has severe fibrocystic changes of the right breast that are quite tender but feel benign Abdominal:     General: There is no distension.     Palpations: Abdomen is soft. There is no hepatomegaly, splenomegaly or mass.     Tenderness: There is no abdominal tenderness.  Musculoskeletal:        General: Normal range of motion.     Cervical  back: Normal range of motion and neck supple. No tenderness.     Right lower leg: No edema.     Left lower leg: No edema.  Lymphadenopathy:     Cervical: No cervical adenopathy.     Upper Body:     Right upper body: No supraclavicular or axillary adenopathy.     Left upper body: No supraclavicular or axillary adenopathy.     Lower Body: No right inguinal adenopathy. No left inguinal adenopathy.  Skin:    General: Skin is warm and dry.     Coloration: Skin is not jaundiced.     Findings: No rash.  Neurological:     Mental Status: She is alert and oriented to person, place, and time.     Cranial Nerves: No cranial nerve deficit.  Psychiatric:        Attention and Perception: Attention and perception normal.        Mood and Affect: Mood is depressed. Affect is tearful.        Speech: Speech normal.        Behavior: Behavior normal.        Thought Content: Thought content normal.        Cognition and Memory: Cognition and memory normal.        Judgment: Judgment normal.    LABS:      Latest Ref Rng & Units 07/31/2021   12:00 AM 07/31/2020   12:00 AM  CBC  WBC  7.3     7.1      Hemoglobin 12.0 - 16.0  13.9     14.4      Hematocrit 36 - 46 43     42      Platelets 150 - 400 K/uL 427     376         This result is from an external source.      Latest Ref Rng & Units 07/31/2021   12:00 AM 07/31/2020   12:00 AM  CMP  BUN 4 - '21 9     9      '$ Creatinine 0.5 - 1.1 0.6     0.6      Sodium 137 - 147 138     136      Potassium 3.5 - 5.1 mEq/L 4.3     3.9      Chloride 99 - 108 111     107      CO2 13 - '22 21     22      '$ Calcium 8.7 - 10.7 8.5     8.6      Alkaline Phos 25 - 125 93     91      AST 13 - 35 26     19      ALT 7 - 35 U/L 25     23         This result is from an external source.    STUDIES:   HISTORY:   Past Medical History:  Diagnosis Date   Breast cancer ([place]) 10/21/2012   Depression 07/31/2020    Past Surgical History:  Procedure Laterality Date   MASTECTOMY Left 05/16/2013    No family history on file.  Social History:  reports that she has been smoking cigarettes. She has a 4.00 pack-year smoking history. She has never used smokeless tobacco. She reports current drug use. Drug: Marijuana. No history on file for alcohol use.The patient is alone today.  Allergies:  Allergies  Allergen Reactions   Lovastatin     Other reaction(s): Other (See Comments) Elevated hepatic enzymes Elevated hepatic enzymes    Niacin Itching    Current Medications: Current Outpatient Medications  Medication Sig Dispense Refill   albuterol (VENTOLIN HFA) 108 (90 Base) MCG/ACT inhaler Inhale into the lungs.     amitriptyline (ELAVIL) 50 MG tablet Take by mouth.     amoxicillin-clavulanate (AUGMENTIN) 875-125 MG tablet Take 1 tablet by mouth 2 (two) times daily. 20 tablet 0   atorvastatin (LIPITOR) 40 MG tablet SMARTSIG:1 Tablet(s) By Mouth Every Evening     Calcium Carbonate-Vitamin D 600-5 MG-MCG TABS Take by mouth.     gabapentin (NEURONTIN) 300 MG capsule Take 1 capsule (300 mg total) by mouth 3 (three) times daily. 90 capsule 5   lisinopril (ZESTRIL) 2.5 MG tablet Take  2.5 mg by mouth daily.     metFORMIN (GLUCOPHAGE-XR) 500 MG 24 hr tablet Take by mouth.     oxyCODONE-acetaminophen (PERCOCET/ROXICET) 5-325 MG tablet Take by mouth.     predniSONE (STERAPRED UNI-PAK 21 TAB) 5 MG (21) TBPK tablet Take by mouth.     SYMBICORT 160-4.5 MCG/ACT inhaler SMARTSIG:2 Puff(s) By Mouth Twice Daily     tamoxifen (NOLVADEX) 20 MG tablet Take 1 tablet (20 mg total) by mouth daily. 90 tablet 3   Tiotropium Bromide-Olodaterol (STIOLTO RESPIMAT) 2.5-2.5 MCG/ACT AERS Inhale into the lungs.     venlafaxine XR (EFFEXOR-XR) 75 MG 24 hr capsule Take 1 capsule (75 mg total) by mouth daily with breakfast. 90 capsule 3   No current facility-administered medications for this visit.     ASSESSMENT & PLAN:   Assessment/Plan:  1. Stage III A hormone receptor positive breast cancer treated with left mastectomy adjuvant chemotherapy and hormonal therapy with tamoxifen. She remains without evidence of recurrence. We have recommended a total of 10 years of tamoxifen.  Unfortunately, she has been on and off the tamoxifen for years. I advised her of the importance of continuing tamoxifen daily to prevent recurrent breast cancer. I will refill that today.  2. Tobacco abuse, once again I advised her of the importance of complete abstinence from tobacco.  I gave her information and encouraged her to also use nicotine replacement to help with smoking cessation. I think she would be a good candidate for a low dose lung cancer screening CT scan but she is not old enough.  3.  Raised white lesion of the buccal mucosa.  I recommend evaluation by an oral surgeon.  4.  Persistent neuropathy.   I will refer her to an oral surgeon regarding the oral lesion, it might be a leukoplakia. I will draw CBC and CMP today.  We will get her back on the gabapentin, 300 mg bid, and she may go up to tid. We will plan to see her back in in 6 months with CBC and CMP. I will schedule a right screening mammogram.The  patient understands the plans discussed today and is in agreement with them.  She knows to contact our office if she develops concerns prior to her next appointment.   I provided 20 minutes of face-to-face time during this this encounter and > 50% was spent counseling as documented under my assessment and plan.    Derwood Kaplan, MD

## 2021-12-09 ENCOUNTER — Inpatient Hospital Stay: Payer: Medicaid Other | Attending: Oncology | Admitting: Oncology

## 2021-12-09 ENCOUNTER — Encounter: Payer: Self-pay | Admitting: Oncology

## 2021-12-09 ENCOUNTER — Other Ambulatory Visit: Payer: Self-pay | Admitting: Oncology

## 2021-12-09 ENCOUNTER — Inpatient Hospital Stay: Payer: Medicaid Other

## 2021-12-09 VITALS — BP 118/67 | HR 61 | Temp 98.2°F | Resp 18 | Ht 61.0 in | Wt 187.8 lb

## 2021-12-09 DIAGNOSIS — I890 Lymphedema, not elsewhere classified: Secondary | ICD-10-CM | POA: Diagnosis not present

## 2021-12-09 DIAGNOSIS — Z170 Estrogen receptor positive status [ER+]: Secondary | ICD-10-CM

## 2021-12-09 DIAGNOSIS — C50412 Malignant neoplasm of upper-outer quadrant of left female breast: Secondary | ICD-10-CM

## 2021-12-09 DIAGNOSIS — H6591 Unspecified nonsuppurative otitis media, right ear: Secondary | ICD-10-CM

## 2021-12-09 DIAGNOSIS — E8989 Other postprocedural endocrine and metabolic complications and disorders: Secondary | ICD-10-CM

## 2021-12-09 LAB — COMPREHENSIVE METABOLIC PANEL
Albumin: 3.9 (ref 3.5–5.0)
Calcium: 9 (ref 8.7–10.7)

## 2021-12-09 LAB — HEPATIC FUNCTION PANEL
ALT: 22 U/L (ref 7–35)
AST: 24 (ref 13–35)
Alkaline Phosphatase: 81 (ref 25–125)
Bilirubin, Total: 0.4

## 2021-12-09 LAB — BASIC METABOLIC PANEL
BUN: 10 (ref 4–21)
CO2: 24 — AB (ref 13–22)
Chloride: 109 — AB (ref 99–108)
Creatinine: 0.7 (ref 0.5–1.1)
Glucose: 129
Potassium: 3.5 mEq/L (ref 3.5–5.1)
Sodium: 138 (ref 137–147)

## 2021-12-09 LAB — CBC AND DIFFERENTIAL
HCT: 43 (ref 36–46)
Hemoglobin: 14.5 (ref 12.0–16.0)
Neutrophils Absolute: 4.44
Platelets: 433 10*3/uL — AB (ref 150–400)
WBC: 8.7

## 2021-12-09 LAB — CBC: RBC: 4.77 (ref 3.87–5.11)

## 2021-12-09 MED ORDER — GABAPENTIN 300 MG PO CAPS: 300.0000 mg | ORAL_CAPSULE | Freq: Three times a day (TID) | ORAL | 5 refills | Status: AC

## 2021-12-09 MED ORDER — AMOXICILLIN-POT CLAVULANATE 875-125 MG PO TABS: 1.0000 | ORAL_TABLET | Freq: Two times a day (BID) | ORAL | 0 refills | Status: DC

## 2022-01-07 ENCOUNTER — Telehealth: Payer: Self-pay

## 2022-01-07 NOTE — Telephone Encounter (Signed)
-----   Message from Derwood Kaplan, MD sent at 01/03/2022  6:07 PM EDT ----- Regarding: call Good news on the bump in her mouth, was benign tumor and margins are clear. Let her know labs look good also, will see her next year

## 2022-06-11 ENCOUNTER — Inpatient Hospital Stay: Payer: Medicaid Other

## 2022-06-11 ENCOUNTER — Other Ambulatory Visit: Payer: Self-pay | Admitting: Oncology

## 2022-06-11 ENCOUNTER — Encounter: Payer: Self-pay | Admitting: Oncology

## 2022-06-11 ENCOUNTER — Inpatient Hospital Stay: Payer: Medicaid Other | Attending: Oncology | Admitting: Oncology

## 2022-06-11 ENCOUNTER — Telehealth: Payer: Self-pay

## 2022-06-11 VITALS — BP 130/93 | HR 65 | Temp 98.8°F | Resp 18 | Ht 61.0 in | Wt 185.1 lb

## 2022-06-11 DIAGNOSIS — R202 Paresthesia of skin: Secondary | ICD-10-CM

## 2022-06-11 DIAGNOSIS — Z170 Estrogen receptor positive status [ER+]: Secondary | ICD-10-CM

## 2022-06-11 DIAGNOSIS — D75839 Thrombocytosis, unspecified: Secondary | ICD-10-CM | POA: Diagnosis not present

## 2022-06-11 DIAGNOSIS — I890 Lymphedema, not elsewhere classified: Secondary | ICD-10-CM

## 2022-06-11 DIAGNOSIS — Z9012 Acquired absence of left breast and nipple: Secondary | ICD-10-CM | POA: Insufficient documentation

## 2022-06-11 DIAGNOSIS — M542 Cervicalgia: Secondary | ICD-10-CM

## 2022-06-11 DIAGNOSIS — C50912 Malignant neoplasm of unspecified site of left female breast: Secondary | ICD-10-CM | POA: Insufficient documentation

## 2022-06-11 DIAGNOSIS — E8989 Other postprocedural endocrine and metabolic complications and disorders: Secondary | ICD-10-CM

## 2022-06-11 DIAGNOSIS — C50412 Malignant neoplasm of upper-outer quadrant of left female breast: Secondary | ICD-10-CM

## 2022-06-11 DIAGNOSIS — M7989 Other specified soft tissue disorders: Secondary | ICD-10-CM

## 2022-06-11 LAB — CBC WITH DIFFERENTIAL (CANCER CENTER ONLY)
Abs Immature Granulocytes: 0.02 10*3/uL (ref 0.00–0.07)
Basophils Absolute: 0.1 10*3/uL (ref 0.0–0.1)
Basophils Relative: 1 %
Eosinophils Absolute: 0.3 10*3/uL (ref 0.0–0.5)
Eosinophils Relative: 4 %
HCT: 41.5 % (ref 36.0–46.0)
Hemoglobin: 13.6 g/dL (ref 12.0–15.0)
Immature Granulocytes: 0 %
Lymphocytes Relative: 39 %
Lymphs Abs: 2.7 10*3/uL (ref 0.7–4.0)
MCH: 30.5 pg (ref 26.0–34.0)
MCHC: 32.8 g/dL (ref 30.0–36.0)
MCV: 93 fL (ref 80.0–100.0)
Monocytes Absolute: 0.6 10*3/uL (ref 0.1–1.0)
Monocytes Relative: 8 %
Neutro Abs: 3.4 10*3/uL (ref 1.7–7.7)
Neutrophils Relative %: 48 %
Platelet Count: 445 10*3/uL — ABNORMAL HIGH (ref 150–400)
RBC: 4.46 MIL/uL (ref 3.87–5.11)
RDW: 13.3 % (ref 11.5–15.5)
WBC Count: 7 10*3/uL (ref 4.0–10.5)
nRBC: 0 % (ref 0.0–0.2)

## 2022-06-11 LAB — CMP (CANCER CENTER ONLY)
ALT: 18 U/L (ref 0–44)
AST: 19 U/L (ref 15–41)
Albumin: 3.6 g/dL (ref 3.5–5.0)
Alkaline Phosphatase: 83 U/L (ref 38–126)
Anion gap: 7 (ref 5–15)
BUN: 8 mg/dL (ref 6–20)
CO2: 24 mmol/L (ref 22–32)
Calcium: 8.6 mg/dL — ABNORMAL LOW (ref 8.9–10.3)
Chloride: 107 mmol/L (ref 98–111)
Creatinine: 0.73 mg/dL (ref 0.44–1.00)
GFR, Estimated: >60 mL/min (ref >60)
Glucose, Bld: 129 mg/dL — ABNORMAL HIGH (ref 70–99)
Potassium: 3.8 mmol/L (ref 3.5–5.1)
Sodium: 138 mmol/L (ref 135–145)
Total Bilirubin: 0.5 mg/dL (ref 0.3–1.2)
Total Protein: 7.3 g/dL (ref 6.5–8.1)

## 2022-06-11 NOTE — Progress Notes (Signed)
Jaclyn Cooper  [address] [place],  [place]  [address] [phone]  Clinic Day: 06/11/22   Referring physician: Madison Hickman, FNP   CHIEF COMPLAINT:  CC: Stage IIIB hormone receptor positive breast cancer with left breast pain  Current Treatment:  Observation   HISTORY OF PRESENT ILLNESS:  Jaclyn Cooper is a 52 y.o. female with a history of stage IIIA (T3 N1NOS M0) hormone receptor positive left breast cancer diagnosed in June 2014.  She presented with an 8.6 cm lesion of the left breast, as well as a 4.5 cm left axillary node.  Biopsies revealed grade 3, invasive ductal carcinoma of the breast, with metastasis to the lymph node.  Estrogen and progesterone receptors were positive and HER 2 Neu negative.  Ki 67 was 17%.  She received neoadjuvant chemotherapy consisting of 6 cycles of docetaxel, doxorubicin, and cyclophosphamide, which was completed in November 2014.  She underwent left mastectomy in January 2015.  Pathology revealed a residual 2 cm invasive ductal carcinoma with a focally positive posterior margin.  Metastasis was seen in 1/6 lymph nodes.  She received adjuvant radiation to the left chest wall and axilla completed in March 2015.  She was placed on tamoxifen in April 2015.  She developed lymphedema of her left arm, for which she received occupational therapy with improvement.  She also had cognitive changes associated with chemotherapy, for which she underwent treatment with the speech therapist.  In May 2015, there was a concern about a possible nodule in the left axilla, but ultrasound was negative.  In March 2017, she had a small left inguinal node, and ultrasound revealed a prominent, but normal appearing left inguinal node.    She has had chronic pain in the left mastectomy site, which had been managed with gabapentin and duloxetine. She was lost to follow-up from November 2018 until September 2019 and had been out of her  tamoxifen.  Duloxetine had been discontinued and she had tizanidine in addition to the gabapentin.  She requested a refill of all her medications, but we had asked that her primary care provider manage her medications. We did refill her gabapentin 300 mg twice daily at that time but asked her to follow-up with her primary care provider as well.  We recommend that she receive a total of 10 years of adjuvant hormonal therapy, but she has had multiple long interruptions due to non-compliance.  At her visit in June 2021, she reported persistent pain of the right breast and left chest wall.  She has fibrocystic changes of the right breast, so was advised to cut back on her caffeine intake and take vitamin E 400 IU once daily. She continued to chronic pain in the left chest wall and axilla without evidence of recurrence. She is a smoker but is too young to qualify for low dose lung cancer screening.    INTERVAL HISTORY:  Jaclyn Cooper is here for follow up of her stage IIIA left breast cancer. Patient states that her burning sensation in her left chest has been the worse it has been since the beginning stages of her breast cancer it radiates to the left axilla and down the inner left arm. It is hard for her to pick up and handle things with that hand. She has numbness and decreased strength of her left hand, and I think nerve conduction tests would be helpful. Her memory has decreased, it is harder for her to formulate certain words, and has some  confusion. She also experiences shortness of breath, blurred vision, severe dizziness and loss of balance. Her worst dizzy spell lasted about 2hrs or longer.She describes being unable to ambulate, feeling out of it and falling over but refused to go to the ER. This occurred about 2 weeks ago.  She also felt a numbness and tingling from her left side of the neck down her left side of the body.  I informed her we can have MRI scans done, certainly of the brain, but also of the cervical  spine to evaluate her left arm symptoms. I believe she may have had a stroke or TIA. She is still taking Gabapentin 317m at bedtime but not consistently as it makes her sleepy. I advised her that she may need to take 1 in the morning and 1-2 at bedtime, as this seems to be neuropathic pain. During her physical exam I found a firm soft tissue area about mid way down her posterior left arm that was very tender, with palpation it gave her sharp pain that shot throughout her whole arm. I will order her a MRI of the brain and cervical spine, a nerve conduction velocity test, X-ray of the left humerus, X-ray of the cervical spine, and a ultrasound of the upper extremity. She is also due for a right screening mammogram. Unfortunately, she continues to smoke but says less than a pack per day. She has been smoking for over 30 years. I think she is a candidate for low dose lung cancer screening CT but she is less than 89years old. Her BP today is 130/93, platelet count last time was 433 and today's labs are pending. I will refer her to a neurologist to evaluate the central nervous symptoms but also the left upper extremity findings which may be a radiculopathy from her cervical spine.  She denies any injury to the neck and no heavy lifting, pulling or pushing that could have triggered her left arm pain. I will also refer her to a dermatologist because of a black nevus in the anterior upper left chest with irregular pigmentation. It has gotten larger and irregular in shape. She will return after she has had all these tests done and we will review the results. She denies signs of infection such as sore throat, sinus drainage, cough, or urinary symptoms.  She denies fevers or recurrent chills. She denies pain. She denies nausea, vomiting, chest pain, dyspnea or cough. Her weight has decreased 2 pounds over last 6 months .  REVIEW OF SYSTEMS:  Review of Systems  Constitutional:  Negative for appetite change, chills,  diaphoresis, fatigue, fever and unexpected weight change.  HENT:  Negative.  Negative for hearing loss, lump/mass, mouth sores, nosebleeds, sore throat, tinnitus, trouble swallowing and voice change.        Dry mouth  Eyes:  Positive for eye problems (blurred vision). Negative for icterus.  Respiratory:  Positive for shortness of breath. Negative for chest tightness, cough, hemoptysis and wheezing.   Cardiovascular: Negative.  Negative for chest pain, leg swelling and palpitations.  Gastrointestinal: Negative.  Negative for abdominal distention, abdominal pain, blood in stool, constipation, diarrhea, nausea, rectal pain and vomiting.  Endocrine: Negative.  Negative for hot flashes.  Genitourinary: Negative.  Negative for bladder incontinence, difficulty urinating, dyspareunia, dysuria, frequency, hematuria, menstrual problem, nocturia, pelvic pain, vaginal bleeding and vaginal discharge.   Musculoskeletal:  Positive for myalgias (left axilla) and neck stiffness (numbness). Negative for arthralgias, back pain, flank pain, gait problem and neck  pain.       Burning sensation in her left chest wall and axilla, radiating down the left arm.  Skin: Negative.  Negative for itching, rash and wound.  Neurological:  Positive for light-headedness and numbness (on her leftside). Negative for dizziness, extremity weakness, gait problem, headaches, seizures and speech difficulty.  Hematological: Negative.  Negative for adenopathy. Does not bruise/bleed easily.  Psychiatric/Behavioral:  Positive for confusion. Negative for decreased concentration, depression, sleep disturbance and suicidal ideas. The patient is not nervous/anxious.        Decreased memory.   All other systems reviewed and are negative.    VITALS:  Blood pressure (!) 130/93, pulse 65, temperature 98.8 F (37.1 C), temperature source Oral, resp. rate 18, height 5' 1"$  (1.549 m), weight 185 lb 1.6 oz (84 kg), SpO2 100 %.  Wt Readings from Last 3  Encounters:  06/11/22 185 lb 1.6 oz (84 kg)  12/09/21 187 lb 12.8 oz (85.2 kg)  07/31/21 196 lb 12.8 oz (89.3 kg)    Body mass index is 34.97 kg/m.  Performance status (ECOG): 1 - Symptomatic but completely ambulatory  PHYSICAL EXAM:  Physical Exam Vitals and nursing note reviewed.  Constitutional:      General: She is not in acute distress.    Appearance: Normal appearance. She is normal weight. She is not ill-appearing, toxic-appearing or diaphoretic.  HENT:     Head: Normocephalic and atraumatic.     Right Ear: Tympanic membrane, ear canal and external ear normal. There is no impacted cerumen.     Left Ear: Tympanic membrane, ear canal and external ear normal. There is no impacted cerumen.     Nose: Nose normal. No congestion or rhinorrhea.     Mouth/Throat:     Mouth: Mucous membranes are moist.     Pharynx: Oropharynx is clear. No oropharyngeal exudate or posterior oropharyngeal erythema.  Eyes:     General: No scleral icterus.       Right eye: No discharge.        Left eye: No discharge.     Extraocular Movements: Extraocular movements intact.     Conjunctiva/sclera: Conjunctivae normal.     Pupils: Pupils are equal, round, and reactive to light.  Neck:     Vascular: No carotid bruit.  Cardiovascular:     Rate and Rhythm: Normal rate and regular rhythm.     Pulses: Normal pulses.     Heart sounds: Normal heart sounds. No murmur heard.    No friction rub. No gallop.  Pulmonary:     Effort: Pulmonary effort is normal. No respiratory distress.     Breath sounds: Normal breath sounds. No stridor. No wheezing, rhonchi or rales.  Chest:     Chest wall: No tenderness.  Breasts:    Right: Normal. No swelling, bleeding, inverted nipple, mass, nipple discharge, skin change or tenderness.     Left: Absent.     Comments: Left mastectomy is negative.   Scar of right posterior shoulder well healed. Scar on right upper chest where her port was. Right breast with masses. Dark  nevus in the anterior shoulder which appears irregular in pigmentation.  Abdominal:     General: Bowel sounds are normal. There is no distension.     Palpations: Abdomen is soft. There is no hepatomegaly, splenomegaly or mass.     Tenderness: There is no abdominal tenderness. There is no right CVA tenderness, left CVA tenderness, guarding or rebound.     Hernia: No hernia  is present.  Musculoskeletal:        General: No swelling, deformity or signs of injury. Normal range of motion.     Right upper arm: No tenderness.     Left upper arm: Tenderness present.     Cervical back: Normal range of motion and neck supple. No rigidity or tenderness.     Right lower leg: No edema.     Left lower leg: No edema.     Comments: Firm soft tissue mass about mid way down her posterior left arm which was extremely tender to palpation.    Lymphadenopathy:     Cervical: No cervical adenopathy.     Upper Body:     Right upper body: No supraclavicular or axillary adenopathy.     Left upper body: No supraclavicular or axillary adenopathy.     Lower Body: No right inguinal adenopathy. No left inguinal adenopathy.  Skin:    General: Skin is warm and dry.     Coloration: Skin is not jaundiced or pale.     Findings: No bruising, erythema, lesion or rash.  Neurological:     General: No focal deficit present.     Mental Status: She is alert and oriented to person, place, and time. Mental status is at baseline.     Cranial Nerves: No cranial nerve deficit.     Sensory: No sensory deficit.     Motor: No weakness.     Coordination: Coordination normal.     Gait: Gait normal.     Deep Tendon Reflexes: Reflexes normal.  Psychiatric:        Attention and Perception: Attention and perception normal.        Mood and Affect: Affect normal. Mood is anxious. Mood is not depressed. Affect is not tearful.        Speech: Speech normal.        Behavior: Behavior normal.        Thought Content: Thought content normal.         Cognition and Memory: Cognition and memory normal.        Judgment: Judgment normal.    LABS:      Latest Ref Rng & Units 06/11/2022    9:05 AM 12/09/2021   12:00 AM 07/31/2021   12:00 AM  CBC  WBC 4.0 - 10.5 K/uL 7.0  8.7     7.3      Hemoglobin 12.0 - 15.0 g/dL 13.6  14.5     13.9      Hematocrit 36.0 - 46.0 % 41.5  43     43      Platelets 150 - 400 K/uL 445  433     427         This result is from an external source.      Latest Ref Rng & Units 06/11/2022    9:05 AM 12/09/2021   12:00 AM 07/31/2021   12:00 AM  CMP  Glucose 70 - 99 mg/dL 129     BUN 6 - 20 mg/dL 8  10     9      $ Creatinine 0.44 - 1.00 mg/dL 0.73  0.7     0.6      Sodium 135 - 145 mmol/L 138  138     138      Potassium 3.5 - 5.1 mmol/L 3.8  3.5     4.3      Chloride 98 - 111 mmol/L 107  109  111      CO2 22 - 32 mmol/L 24  24     21      $ Calcium 8.9 - 10.3 mg/dL 8.6  9.0     8.5      Total Protein 6.5 - 8.1 g/dL 7.3     Total Bilirubin 0.3 - 1.2 mg/dL 0.5     Alkaline Phos 38 - 126 U/L 83  81     93      AST 15 - 41 U/L 19  24     26      $ ALT 0 - 44 U/L 18  22     25         $ This result is from an external source.    STUDIES:   HISTORY:   Past Medical History:  Diagnosis Date   Breast cancer ([place]) 10/21/2012   Depression 07/31/2020    Past Surgical History:  Procedure Laterality Date   MASTECTOMY Left 05/16/2013    No family history on file.  Social History:  reports that she has been smoking cigarettes. She has a 4.00 pack-year smoking history. She has never used smokeless tobacco. She reports current drug use. Drug: Marijuana. No history on file for alcohol use.The patient is alone today.  Allergies:  Allergies  Allergen Reactions   Lovastatin     Other reaction(s): Other (See Comments) Elevated hepatic enzymes Elevated hepatic enzymes    Niacin Itching    Current Medications: Current Outpatient Medications  Medication Sig Dispense Refill   albuterol (VENTOLIN HFA) 108  (90 Base) MCG/ACT inhaler Inhale into the lungs.     amitriptyline (ELAVIL) 50 MG tablet Take by mouth.     atorvastatin (LIPITOR) 40 MG tablet SMARTSIG:1 Tablet(s) By Mouth Every Evening     Calcium Carbonate-Vitamin D 600-5 MG-MCG TABS Take by mouth.     gabapentin (NEURONTIN) 300 MG capsule Take 1 capsule (300 mg total) by mouth 3 (three) times daily. 90 capsule 5   lisinopril (ZESTRIL) 2.5 MG tablet Take 2.5 mg by mouth daily.     metFORMIN (GLUCOPHAGE-XR) 500 MG 24 hr tablet Take by mouth.     SYMBICORT 160-4.5 MCG/ACT inhaler SMARTSIG:2 Puff(s) By Mouth Twice Daily     tamoxifen (NOLVADEX) 20 MG tablet Take 1 tablet (20 mg total) by mouth daily. 90 tablet 3   Tiotropium Bromide-Olodaterol (STIOLTO RESPIMAT) 2.5-2.5 MCG/ACT AERS Inhale into the lungs.     No current facility-administered medications for this visit.     ASSESSMENT & PLAN:   Assessment/Plan:  1. Stage III A hormone receptor positive breast cancer treated with left mastectomy adjuvant chemotherapy and hormonal therapy with tamoxifen. She remains without evidence of recurrence. We have recommended a total of 10 years of tamoxifen.  Unfortunately, she has been on and off the tamoxifen for years. We may need to consider stopping it with the risk of thromboembolic events and her current symptoms.  2. Tobacco abuse, once again I advised her of the importance of complete abstinence from tobacco.    3.  Black nevus of the left anterior upper chest which appears to be changing and needs to be evaluated.   4.  Persistent neuropathy.   5. Impaired memory with difficulty with word finding. She also has visual blurring and so I will get an MRI of the brain.  6. Pain of the neck radiating down the left arm with possible weakness of the left arm. I think she needs cervical spine X-rays and  ultimately MRI of the cervical spine and nerve conduction velocity.  7. Soft tissue mass of posterior aspect of the left upper arm. This is  extremely tender to deep palpation and caused her paresthesias.    Plan She has weakness in her hands that has worsened and her memory has decreased. She had confusion and at times difficulty with formulating words. She has blurred vision, loss of balance and dizzy spells, her worst spell lasted about 2 hours or longer. I will have a nerve conduction velocity test and MRI scans done of brain and cervical spine. I believe she may have had a stroke or TIA. She is still taking Gabapentin 374m at bedtime but not consistently. I advised her that she may need to take 1 in the morning and 1-2 at bedtime to help as this seems to be neuropathic pain. She had a soft tissue mass in the left posterior upper arm which was extremely tender with deep palpation and associated paresthesias. She may need an MRI scan of her left upper arm but we will see what the X-rays show first. I will order an MRI of the brain and cervical spine, a nerve conduction velocity test, X-ray of the left humerus, X-ray of the cervical spine, and an ultrasound of the upper extremity. She is also due for a right screening mammogram. Unfortunately, she continues to smoke but says less than a pack per day. I will refer her to a neurologist and a dermatologist because of a black nevus in the anterior upper left chest with irregular pigmentation. I will see her back after these tests are done to review the results and make any further recommendations, possibly an MRI of the left arm. The patient understands the plans discussed today and is in agreement with them.  She knows to contact our office if she develops concerns prior to her next appointment.   I provided 30 minutes of face-to-face time during this this encounter and > 50% was spent counseling as documented under my assessment and plan.     I,Jasmine M Lassiter,acting as a scribe for CDerwood Kaplan MD.,have documented all relevant documentation on the behalf of CDerwood Kaplan  MD,as directed by  CDerwood Kaplan MD while in the presence of CDerwood Kaplan MD.

## 2022-06-11 NOTE — Telephone Encounter (Signed)
-----   Message from Derwood Kaplan, MD sent at 06/10/2022  1:30 PM EST ----- Regarding: mammo Did she have mammo last year?  Looks like due August 2023

## 2022-06-11 NOTE — Telephone Encounter (Signed)
Looks like patient no showed for Mammo in  11/2021 and rescheduled to 12/2021 and no showed then.

## 2022-06-12 DIAGNOSIS — D75839 Thrombocytosis, unspecified: Secondary | ICD-10-CM | POA: Insufficient documentation

## 2022-06-12 NOTE — Telephone Encounter (Signed)
-----   Message from Derwood Kaplan, MD sent at 06/12/2022  7:07 AM EST ----- Regarding: call Tell her labs okay but the platelets are still elevated. Her calcium is mildly low, I rec she take 1 daily

## 2022-06-12 NOTE — Telephone Encounter (Signed)
Attempted to contact patient and no answer

## 2022-06-16 ENCOUNTER — Telehealth: Payer: Self-pay

## 2022-06-16 NOTE — Telephone Encounter (Signed)
-----   Message from Derwood Kaplan, MD sent at 06/12/2022  7:07 AM EST ----- Regarding: call Tell her labs okay but the platelets are still elevated. Her calcium is mildly low, I rec she take 1 daily

## 2022-06-16 NOTE — Telephone Encounter (Signed)
Patient notified of lab results and that she needs to start a calcium supplement.

## 2022-06-18 ENCOUNTER — Encounter: Payer: Self-pay | Admitting: Oncology

## 2022-06-19 ENCOUNTER — Telehealth: Payer: Self-pay | Admitting: Oncology

## 2022-06-19 NOTE — Telephone Encounter (Signed)
06/19/22 Spoke with patient on rescheduled MRI appts

## 2022-06-20 NOTE — Progress Notes (Signed)
Jaclyn Cooper  [address] [place],  [place]  [address] [phone]  Clinic Day: 06/23/22  Referring physician: Madison Hickman, FNP   CHIEF COMPLAINT:  CC: Stage IIIB hormone receptor positive breast cancer with left breast pain  Current Treatment:  Observation   HISTORY OF PRESENT ILLNESS:  Jaclyn Cooper is a 52 y.o. female with a history of stage IIIA (T3 N1NOS M0) hormone receptor positive left breast cancer diagnosed in June 2014.  She presented with an 8.6 cm lesion of the left breast, as well as a 4.5 cm left axillary node.  Biopsies revealed grade 3, invasive ductal carcinoma of the breast, with metastasis to the lymph node.  Estrogen and progesterone receptors were positive and HER 2 Neu negative.  Ki 67 was 17%.  She received neoadjuvant chemotherapy consisting of 6 cycles of docetaxel, doxorubicin, and cyclophosphamide, which was completed in November 2014.  She underwent left mastectomy in January 2015.  Pathology revealed a residual 2 cm invasive ductal carcinoma with a focally positive posterior margin.  Metastasis was seen in 1/6 lymph nodes.  She received adjuvant radiation to the left chest wall and axilla completed in March 2015.  She was placed on tamoxifen in April 2015.  She developed lymphedema of her left arm, for which she received occupational therapy with improvement.  She also had cognitive changes associated with chemotherapy, for which she underwent treatment with the speech therapist.  In May 2015, there was a concern about a possible nodule in the left axilla, but ultrasound was negative.  In March 2017, she had a small left inguinal node, and ultrasound revealed a prominent, but normal appearing left inguinal node.    She has had chronic pain in the left mastectomy site, which had been managed with gabapentin and duloxetine. She was lost to follow-up from November 2018 until September 2019 and had been out of her  tamoxifen.  Duloxetine had been discontinued and she had tizanidine in addition to the gabapentin.  She requested a refill of all her medications, but we had asked that her primary care provider manage her medications. We did refill her gabapentin 300 mg twice daily at that time but asked her to follow-up with her primary care provider as well.  We recommend that she receive a total of 10 years of adjuvant hormonal therapy, but she has had multiple long interruptions due to non-compliance.  At her visit in June 2021, she reported persistent pain of the right breast and left chest wall.  She has fibrocystic changes of the right breast, so was advised to cut back on her caffeine intake and take vitamin E 400 IU once daily. She continued to chronic pain in the left chest wall and axilla without evidence of recurrence. She is a smoker but is too young to qualify for low dose lung cancer screening.    INTERVAL HISTORY:  Jaclyn Cooper is here for follow up of her history of stage IIIA left breast cancer. Patient states that she is ok and complains of left arm pain rating 8/10. Her MRI of the neck and brain are rescheduled for 03/062024. Her cervical spine x-ray showed severe degenerative disk disease especially in the C5, C6, and C7 that has pressure on the nerves of both sides. It also revealed bone spurs in her neck. I advised her she will need to see an orthopedist or neurologist. I will make her an referral to one in [place]. The x-ray of her humerus  was negative. Her ultrasound of that area was clear which leads me to believe that she pulled a muscle in her left arm. I advised her to take care of it and alternate with hot and cold compresses. I will see her back in 6 months with CBC and CMP.  She denies signs of infection such as sore throat, sinus drainage, cough, or urinary symptoms.  She denies fevers or recurrent chills. She denies pain. She denies nausea, vomiting, chest pain, dyspnea or cough. Her appetite is up  and down and her weight has been stable.  REVIEW OF SYSTEMS:  Review of Systems  Constitutional:  Negative for appetite change, chills, diaphoresis, fatigue, fever and unexpected weight change.  HENT:  Negative.  Negative for hearing loss, lump/mass, mouth sores, nosebleeds, sore throat, tinnitus, trouble swallowing and voice change.        Dry mouth  Eyes: Negative.  Negative for eye problems (blurred vision) and icterus.  Respiratory: Negative.  Negative for chest tightness, cough, hemoptysis, shortness of breath and wheezing.   Cardiovascular: Negative.  Negative for chest pain, leg swelling and palpitations.  Gastrointestinal: Negative.  Negative for abdominal distention, abdominal pain, blood in stool, constipation, diarrhea, nausea, rectal pain and vomiting.  Endocrine: Negative.  Negative for hot flashes.  Genitourinary: Negative.  Negative for bladder incontinence, difficulty urinating, dyspareunia, dysuria, frequency, hematuria, menstrual problem, nocturia, pelvic pain, vaginal bleeding and vaginal discharge.   Musculoskeletal:  Positive for myalgias (left axilla) and neck stiffness (numbness). Negative for arthralgias, back pain, flank pain, gait problem and neck pain.       Burning sensation in her left chest wall and axilla, radiating down the left arm.  Skin: Negative.  Negative for itching, rash and wound.  Neurological:  Positive for light-headedness and numbness (on her leftside). Negative for dizziness, extremity weakness, gait problem, headaches, seizures and speech difficulty.  Hematological: Negative.  Negative for adenopathy. Does not bruise/bleed easily.  Psychiatric/Behavioral:  Positive for confusion. Negative for decreased concentration, depression, sleep disturbance and suicidal ideas. The patient is not nervous/anxious.        Decreased memory.   All other systems reviewed and are negative.   VITALS:  Blood pressure 124/88, pulse 78, temperature 98.1 F (36.7 C),  temperature source Oral, resp. rate 17, height '5\' 1"'$  (1.549 m), weight 186 lb 8 oz (84.6 kg), SpO2 99 %.  Wt Readings from Last 3 Encounters:  06/23/22 186 lb 8 oz (84.6 kg)  06/11/22 185 lb 1.6 oz (84 kg)  12/09/21 187 lb 12.8 oz (85.2 kg)    Body mass index is 35.24 kg/m.  Performance status (ECOG): 1 - Symptomatic but completely ambulatory  PHYSICAL EXAM:  Physical Exam Vitals and nursing note reviewed.  Constitutional:      General: She is not in acute distress.    Appearance: Normal appearance. She is normal weight. She is not ill-appearing, toxic-appearing or diaphoretic.  HENT:     Head: Normocephalic and atraumatic.     Right Ear: Tympanic membrane, ear canal and external ear normal. There is no impacted cerumen.     Left Ear: Tympanic membrane, ear canal and external ear normal. There is no impacted cerumen.     Nose: Nose normal. No congestion or rhinorrhea.     Mouth/Throat:     Mouth: Mucous membranes are moist.     Pharynx: Oropharynx is clear. No oropharyngeal exudate or posterior oropharyngeal erythema.  Eyes:     General: No scleral icterus.  Right eye: No discharge.        Left eye: No discharge.     Extraocular Movements: Extraocular movements intact.     Conjunctiva/sclera: Conjunctivae normal.     Pupils: Pupils are equal, round, and reactive to light.  Neck:     Vascular: No carotid bruit.  Cardiovascular:     Rate and Rhythm: Normal rate and regular rhythm.     Pulses: Normal pulses.     Heart sounds: Normal heart sounds. No murmur heard.    No friction rub. No gallop.  Pulmonary:     Effort: Pulmonary effort is normal. No respiratory distress.     Breath sounds: Normal breath sounds. No stridor. No wheezing, rhonchi or rales.  Chest:     Chest wall: No tenderness.  Breasts:    Right: Normal. No swelling, bleeding, inverted nipple, mass, nipple discharge, skin change or tenderness.     Left: Absent.     Comments: Left mastectomy is negative.    Scar of right posterior shoulder well healed. Scar on right upper chest where her port was. Right breast with masses. Dark nevus in the anterior shoulder which appears irregular in pigmentation.  Abdominal:     General: Bowel sounds are normal. There is no distension.     Palpations: Abdomen is soft. There is no hepatomegaly, splenomegaly or mass.     Tenderness: There is no abdominal tenderness. There is no right CVA tenderness, left CVA tenderness, guarding or rebound.     Hernia: No hernia is present.  Musculoskeletal:        General: No swelling, deformity or signs of injury. Normal range of motion.     Right upper arm: No tenderness.     Left upper arm: Tenderness present.     Cervical back: Normal range of motion and neck supple. No rigidity or tenderness.     Right lower leg: No edema.     Left lower leg: No edema.     Comments: Firm soft tissue about mid way down her posterior left arm which was extremely tender to palpation.    Lymphadenopathy:     Cervical: No cervical adenopathy.     Upper Body:     Right upper body: No supraclavicular or axillary adenopathy.     Left upper body: No supraclavicular or axillary adenopathy.     Lower Body: No right inguinal adenopathy. No left inguinal adenopathy.  Skin:    General: Skin is warm and dry.     Coloration: Skin is not jaundiced or pale.     Findings: No bruising, erythema, lesion or rash.  Neurological:     General: No focal deficit present.     Mental Status: She is alert and oriented to person, place, and time. Mental status is at baseline.     Cranial Nerves: No cranial nerve deficit.     Sensory: No sensory deficit.     Motor: No weakness.     Coordination: Coordination normal.     Gait: Gait normal.     Deep Tendon Reflexes: Reflexes normal.  Psychiatric:        Attention and Perception: Attention and perception normal.        Mood and Affect: Affect normal. Mood is anxious. Mood is not depressed. Affect is not  tearful.        Speech: Speech normal.        Behavior: Behavior normal.        Thought Content: Thought content  normal.        Cognition and Memory: Cognition and memory normal.        Judgment: Judgment normal.    LABS:      Latest Ref Rng & Units 06/11/2022    9:05 AM 12/09/2021   12:00 AM 07/31/2021   12:00 AM  CBC  WBC 4.0 - 10.5 K/uL 7.0  8.7     7.3      Hemoglobin 12.0 - 15.0 g/dL 13.6  14.5     13.9      Hematocrit 36.0 - 46.0 % 41.5  43     43      Platelets 150 - 400 K/uL 445  433     427         This result is from an external source.      Latest Ref Rng & Units 06/11/2022    9:05 AM 12/09/2021   12:00 AM 07/31/2021   12:00 AM  CMP  Glucose 70 - 99 mg/dL 129     BUN 6 - 20 mg/dL '8  10     9      '$ Creatinine 0.44 - 1.00 mg/dL 0.73  0.7     0.6      Sodium 135 - 145 mmol/L 138  138     138      Potassium 3.5 - 5.1 mmol/L 3.8  3.5     4.3      Chloride 98 - 111 mmol/L 107  109     111      CO2 22 - 32 mmol/L '24  24     21      '$ Calcium 8.9 - 10.3 mg/dL 8.6  9.0     8.5      Total Protein 6.5 - 8.1 g/dL 7.3     Total Bilirubin 0.3 - 1.2 mg/dL 0.5     Alkaline Phos 38 - 126 U/L 83  81     93      AST 15 - 41 U/L '19  24     26      '$ ALT 0 - 44 U/L '18  22     25         '$ This result is from an external source.   STUDIES:  Exam: 06/18/2022 Digital Screening Unilateral Right Mammogram with CAD and Tomosynthesis Impression: No mammographic evidence of malignancy.   Exam: 06/18/2022 Ultrasound Left Upper Extremity Limited Impression: No sonographic abnormality in the area of the patient's concern. Recommend clinical sureilance and re-imaging with MRI as indicated.     HISTORY:   Past Medical History:  Diagnosis Date   Breast cancer ([place]) 10/21/2012   Depression 07/31/2020    Past Surgical History:  Procedure Laterality Date   MASTECTOMY Left 05/16/2013    No family history on file.  Social History:  reports that she has been smoking cigarettes. She has a  4.00 pack-year smoking history. She has never used smokeless tobacco. She reports current drug use. Drug: Marijuana. No history on file for alcohol use.The patient is alone today.  Allergies:  Allergies  Allergen Reactions   Lovastatin Other (See Comments)    Other reaction(s): Other (See Comments)  Elevated hepatic enzymes   Niacin Itching    Current Medications: Current Outpatient Medications  Medication Sig Dispense Refill   albuterol (VENTOLIN HFA) 108 (90 Base) MCG/ACT inhaler Inhale into the lungs.     amitriptyline (ELAVIL) 50 MG tablet Take by mouth.  atorvastatin (LIPITOR) 40 MG tablet SMARTSIG:1 Tablet(s) By Mouth Every Evening     Calcium Carbonate-Vitamin D 600-5 MG-MCG TABS Take by mouth.     gabapentin (NEURONTIN) 300 MG capsule Take 1 capsule (300 mg total) by mouth 3 (three) times daily. 90 capsule 5   lisinopril (ZESTRIL) 2.5 MG tablet Take 2.5 mg by mouth daily.     metFORMIN (GLUCOPHAGE-XR) 500 MG 24 hr tablet Take by mouth.     SYMBICORT 160-4.5 MCG/ACT inhaler SMARTSIG:2 Puff(s) By Mouth Twice Daily     tamoxifen (NOLVADEX) 20 MG tablet Take 1 tablet (20 mg total) by mouth daily. 90 tablet 3   Tiotropium Bromide-Olodaterol (STIOLTO RESPIMAT) 2.5-2.5 MCG/ACT AERS Inhale into the lungs.     No current facility-administered medications for this visit.     ASSESSMENT & PLAN:   Assessment/Plan:  1. Stage III A hormone receptor positive breast cancer treated with left mastectomy adjuvant chemotherapy and hormonal therapy with tamoxifen. She remains without evidence of recurrence. We have recommended a total of 10 years of tamoxifen.  Unfortunately, she has been on and off the tamoxifen for years. We may need to consider stopping it with the risk of thromboembolic events and her current symptoms.  2. Tobacco abuse, once again I advised her of the importance of complete abstinence from tobacco.    3.  Black nevus of the left anterior upper chest which appears to  be changing and needs to be evaluated. We have referred her to the dermatologist.   4.  Persistent neuropathy.   5. Impaired memory with difficulty with word finding. She also has visual blurring and so I will get an MRI of the brain and consider referral to a neurologist.  6. Pain of the neck radiating down the left arm with possible weakness of the left arm. Her cervical spine x-ray showed severe degenerative disk disease especially in the C5, C6, and C7 that has pressure on the nerves of both sides. It also revealed osteophytes in her neck. I think this is the explanation for her neck and left arm pain and will refer her to a spine surgeon. We will still proceed with the MRI of the cervical spine.   7. Soft tissue of posterior aspect of the left upper arm. This is extremely tender to deep palpation and caused her paresthesias. X-ray and ultrasound are negative so I feel this is a torn or bruised muscle and recommended symptomatic treatment.   Plan Her cervical spine x-ray showed severe degenerative disk disease especially in the C5, C6, and C7 that has pressure on the nerves of both sides. It also revealed bone spurs in her neck. I will make her an referral to a orthopedist or neurologist [facility]. The x-ray of her humerus was negative. Her ultrasound of that area was clear which leads me to believe that she pulled a muscle in her left arm. I advised her to take care of it and alternate with hot and cold compresses. I will see her back in 6 months with CBC and CMP.  The patient understands the plans discussed today and is in agreement with them.  She knows to contact our office if she develops concerns prior to her next appointment.  I provided 30 minutes of face-to-face time during this this encounter and > 50% was spent counseling as documented under my assessment and plan.    I,Jasmine M Lassiter,acting as a scribe for Derwood Kaplan, MD.,have documented all relevant documentation on the  behalf  of Derwood Kaplan, MD,as directed by  Derwood Kaplan, MD while in the presence of Derwood Kaplan, MD.

## 2022-06-23 ENCOUNTER — Telehealth: Payer: Self-pay | Admitting: Oncology

## 2022-06-23 ENCOUNTER — Encounter: Payer: Self-pay | Admitting: Oncology

## 2022-06-23 ENCOUNTER — Other Ambulatory Visit: Payer: Self-pay | Admitting: Oncology

## 2022-06-23 ENCOUNTER — Inpatient Hospital Stay: Payer: Medicaid Other | Admitting: Oncology

## 2022-06-23 VITALS — BP 124/88 | HR 78 | Temp 98.1°F | Resp 17 | Ht 61.0 in | Wt 186.5 lb

## 2022-06-23 DIAGNOSIS — Z170 Estrogen receptor positive status [ER+]: Secondary | ICD-10-CM

## 2022-06-23 DIAGNOSIS — I890 Lymphedema, not elsewhere classified: Secondary | ICD-10-CM | POA: Diagnosis not present

## 2022-06-23 DIAGNOSIS — E8989 Other postprocedural endocrine and metabolic complications and disorders: Secondary | ICD-10-CM | POA: Diagnosis not present

## 2022-06-23 DIAGNOSIS — C50412 Malignant neoplasm of upper-outer quadrant of left female breast: Secondary | ICD-10-CM | POA: Diagnosis not present

## 2022-06-23 NOTE — Telephone Encounter (Signed)
Patient has been scheduled for follow-up visit per 06/23/22 LOS.  Pt given an appt calendar with date and time.

## 2022-06-25 ENCOUNTER — Telehealth: Payer: Self-pay

## 2022-06-25 NOTE — Telephone Encounter (Signed)
Referral sent to [facility] in [place] on [address].

## 2022-06-25 NOTE — Telephone Encounter (Signed)
-----   Message from Derwood Kaplan, MD sent at 06/23/2022  3:02 PM EST ----- Regarding: refer Needs referral to spine surgeon in [place] - either spine specialist or Neurosurgeon Severe degenerative disk disease, esp C5-6-7

## 2022-07-01 ENCOUNTER — Encounter: Payer: Self-pay | Admitting: Oncology

## 2022-07-11 ENCOUNTER — Encounter: Payer: Self-pay | Admitting: Oncology

## 2022-07-12 ENCOUNTER — Telehealth: Payer: Self-pay

## 2022-07-12 NOTE — Telephone Encounter (Signed)
Referral sent Neurology

## 2022-07-12 NOTE — Telephone Encounter (Signed)
-----   Message from Derwood Kaplan, MD sent at 07/09/2022  1:41 PM EDT ----- Regarding: scans I called her about the MRI scans - brain is clear but C spine has significant DDD with mild to moderate spinal stenosis. She has an appt coming up that you referred, was that neurosurgeon or ortho? We need to send them copies of these scans.  Who was it?

## 2022-08-04 ENCOUNTER — Ambulatory Visit: Payer: Medicaid Other | Attending: Physical Therapy | Admitting: Physical Therapy

## 2022-08-04 DIAGNOSIS — M542 Cervicalgia: Secondary | ICD-10-CM | POA: Insufficient documentation

## 2022-08-04 DIAGNOSIS — M5412 Radiculopathy, cervical region: Secondary | ICD-10-CM | POA: Insufficient documentation

## 2022-08-04 DIAGNOSIS — M25512 Pain in left shoulder: Secondary | ICD-10-CM | POA: Insufficient documentation

## 2022-08-04 DIAGNOSIS — G8929 Other chronic pain: Secondary | ICD-10-CM | POA: Insufficient documentation

## 2022-08-04 DIAGNOSIS — M25511 Pain in right shoulder: Secondary | ICD-10-CM | POA: Insufficient documentation

## 2022-08-19 ENCOUNTER — Ambulatory Visit: Payer: Medicaid Other

## 2022-08-19 ENCOUNTER — Other Ambulatory Visit: Payer: Self-pay

## 2022-08-19 DIAGNOSIS — M25512 Pain in left shoulder: Secondary | ICD-10-CM | POA: Diagnosis present

## 2022-08-19 DIAGNOSIS — M25511 Pain in right shoulder: Secondary | ICD-10-CM | POA: Diagnosis present

## 2022-08-19 DIAGNOSIS — M5412 Radiculopathy, cervical region: Secondary | ICD-10-CM

## 2022-08-19 DIAGNOSIS — G8929 Other chronic pain: Secondary | ICD-10-CM | POA: Diagnosis present

## 2022-08-19 DIAGNOSIS — M542 Cervicalgia: Secondary | ICD-10-CM | POA: Diagnosis not present

## 2022-08-19 NOTE — Therapy (Addendum)
OUTPATIENT PHYSICAL THERAPY CERVICAL EVALUATION   Patient Name: Jaclyn Cooper MRN: [id number] DOB:01-10-1971, 52 y.o., female Today's Date: 08/19/2022  END OF SESSION:  PT End of Session - 08/19/22 1133     Visit Number 1    Number of Visits 16    Date for PT Re-Evaluation 10/14/22    PT Start Time 1136    PT Stop Time 1222    PT Time Calculation (min) 46 min    Activity Tolerance Patient limited by pain    Behavior During Therapy [facility] for tasks assessed/performed             Past Medical History:  Diagnosis Date   Breast cancer 10/21/2012   Depression 07/31/2020   Past Surgical History:  Procedure Laterality Date   MASTECTOMY Left 05/16/2013   Patient Active Problem List   Diagnosis Date Noted   Thrombocytosis, unspecified 06/12/2022    Class: Chronic   Mass of soft tissue of upper arm 06/11/2022    Class: Diagnosis of   Neck pain on left side 06/11/2022    Class: Chronic   Tobacco abuse 07/31/2021    Class: Chronic   Lymphedema of upper extremity following lymphadenectomy 07/31/2015    Class: Chronic   Malignant neoplasm of upper-outer quadrant of left breast in female, estrogen receptor positive 10/21/2012    PCP: Jerrye Bushy, FNP  REFERRING PROVIDER: Donalee Citrin, MD  REFERRING DIAG: Radiculopathy, cervical region [phone]   THERAPY DIAG:  Chronic pain of both shoulders  Radiculopathy, cervical region  Rationale for Evaluation and Treatment: Rehabilitation  ONSET DATE: approx. 1 year  SUBJECTIVE:                                                                                                                                                                                                         SUBJECTIVE STATEMENT: Patient reports to PT with persistent neck pain with gradual onset with radiating pain to bilateral shoulders. She states that the pain is worse in left arm. 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 She also has begun having headaches and neck pain about 2 months that is worse in the front of her head.   "Sometimes, I am holding a cigarette in my right hand and I'll just drop it. It's happened a few times." Patient reports that it feels similar to her foot drop.    Hand dominance: Right  PERTINENT HISTORY:  Pmhx of breast cancer, L  mastectomy (10 years ago) right-sided foot drop, diabetes, hypercholesterolemia, HTN   PAIN:  Are you having pain? Yes: NPRS scale: 8/10 Pain location: neck, thoracic/periscap, bilateral shoulders, B arms Pain description: dull, stabbing, burning, numbness, occasional cramps in thoracic/lumbar spine  Aggravating factors: bed mobility, driving with the seatbelt  Relieving factors: laying down, rest   PRECAUTIONS: None  WEIGHT BEARING RESTRICTIONS: No  FALLS:  Has patient fallen in last 6 months? No  LIVING ENVIRONMENT: Lives with: lives with their family Lives in: House/apartment Stairs: No Has following equipment at home: Single point cane and Environmental consultant - 2 wheeled  OCCUPATION: n/a  PLOF: Independent with basic ADLs, help from home health nursing and family for meal preparation  PATIENT GOALS: To have less pain with driving her daughter to school, getting out of bed, and less headaches.   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 She has got normal neurologic exam will try her on some physical therapy and see how she tolerated that I will see her back in 2 months time.   PATIENT SURVEYS:  NDI 80% current  COGNITION: Overall cognitive status: Within functional limits for tasks  assessed  SENSATION: Not tested  POSTURE: rounded shoulders and forward head  PALPATION: Tenderness to palpation bilaterally    CERVICAL ROM:   Active ROM A/PROM (deg) eval  Flexion WFL  Extension 30  Right lateral flexion WFL  Left lateral flexion 30  Right rotation 30  Left rotation 60   (Blank rows = not tested)  UPPER EXTREMITY MMT:  MMT Right eval Left eval  Shoulder flexion 4- 4  Shoulder extension    Shoulder abduction 4- 4  Shoulder adduction    Shoulder extension    Shoulder internal rotation    Shoulder external rotation    Middle trapezius    Lower trapezius    Elbow flexion 4 4-  Elbow extension 4 3+  Wrist flexion    Wrist extension    Wrist ulnar deviation    Wrist radial deviation    Wrist pronation    Wrist supination    Grip strength 45# avg (3 trials) 35# avg (3 trials)   (Blank rows = not tested)   [facility] Adult PT Treatment:                                                DATE: 08/19/2022  Therapeutic Exercise: Supine cervical retraction x 5  Supine cervical AROM x 2 each   PATIENT EDUCATION:  Education details: provided and reviewed HEP Person educated: Patient Education method: Explanation, Demonstration, and Handouts Education comprehension: verbalized understanding, returned demonstration, and needs further education  HOME EXERCISE PROGRAM: Access Code: [email or web address] URL: [email or web address] Date: 08/19/2022 Prepared by: Mauri Reading  Exercises - Supine Cervical Retraction with Towel  - 2-3 x daily - 7 x weekly - 2 sets - 10 reps - 3 sec hold - Supine Cervical Rotation AROM on Pillow  - 2-3 x daily - 7 x weekly - 2 sets - 10 reps - Seated Scapular Retraction  - 2-3 x daily - 7 x weekly - 2  sets - 10 reps - 3 sec hold  ASSESSMENT:  CLINICAL IMPRESSION: Patient is a 52 y.o. female who was seen today for physical therapy evaluation and treatment for persistent Neck Pain, bilateral shoulder pain, and frequent  headaches. She is demonstrating decreased CS AROM, decreased bilateral UE strength, and diminished grip strength bilaterally. She has related pain and difficulty with driving, bed mobility, holding head down to read, and has decreased sleep quality. Patient will benefit from skilled PT services to address relevant deficits, improved pain management and return to PLOF.    OBJECTIVE IMPAIRMENTS: decreased activity tolerance, decreased ROM, decreased strength, impaired UE functional use, postural dysfunction, and pain.   ACTIVITY LIMITATIONS: carrying, lifting, sleeping, bed mobility, dressing, and hygiene/grooming  PARTICIPATION LIMITATIONS: meal prep, cleaning, laundry, driving, and community activity  PERSONAL FACTORS: Age, Past/current experiences, Time since onset of injury/illness/exacerbation, and 3+ comorbidities: Pmhx of breast cancer, diabetes, hypercholesterolemia, HTN   are also affecting patient's functional outcome.   REHAB POTENTIAL: Fair   CLINICAL DECISION MAKING: Evolving/moderate complexity  EVALUATION COMPLEXITY: Moderate   GOALS: Goals reviewed with patient? Yes  SHORT TERM GOALS: Target date: 09/16/2022   Patient will have decreased headaches to 4 days/week or less in order to improve QOL.  Baseline: constant, daily headaches Goal status: INITIAL  2.  Patient will be independent with home program to address postural endurance, cervical ROM, and UE strength.  Baseline: initiated at eval Goal status: INITIAL    LONG TERM GOALS: Target date: 10/14/2022   Patient will report ability to drive at least 45 minutes without exacerbation of symptoms in order to transport her daughter to school.  Baseline: constant pain with driving  Goal status: INITIAL  2.  Patient will report ability to centralize and/or decrease her symptoms with her prescribed home program in order to promote independent pain management strategies following discharge from PT.  Goal status:  INITIAL  3.  Patient will report ability to perform bed mobility activities in the morning without exacerbation of symptoms.  Baseline: unable to perform without assistance d/t reported pain and stiffness Goal status: INITIAL  4.  Patient will have increased grip strength to at least 50# bilaterally.  Baseline: see objective findings  Goal status: INITIAL  5.  Patient will demonstrate at least 4+/5 MMT bilaterally in order to improved return to ADLs.  Baseline: see objective findings  Goal status: INITIAL  6.  Patient will have decreased NDI score to 60% or less in order to demonstrate improved tolerance of functional activities and improved overall QOL.  Baseline: 80% Goal status: INITIAL    PLAN:  PT FREQUENCY: 2x/week  PT DURATION: 8 weeks  PLANNED INTERVENTIONS: Therapeutic exercises, Therapeutic activity, Neuromuscular re-education, Patient/Family education, Self Care, Joint mobilization, Dry Needling, Electrical stimulation, Spinal mobilization, Cryotherapy, Moist heat, Taping, Traction, Manual therapy, and Re-evaluation  PLAN FOR NEXT SESSION: review and progress home program as needed. Initiate manual therapy, trial cervical distraction. Postural reeducation and scapular retraction. Progress cervical mobility and strengthening program as appropriate.   Check all possible CPT codes: 16109 - PT Re-evaluation, 97110- Therapeutic Exercise, [phone]- Neuro Re-education, [phone] - Gait Training, [phone] - Manual Therapy, [phone] - Therapeutic Activities, [phone] - Self Care, and [phone] - Electrical stimulation (Manual)    Check all conditions that are expected to impact treatment: Conditions expected to impact treatment:Psychological or psychiatric disorders  If treatment provided at initial evaluation, no treatment charged due to lack of authorization.       Mauri Reading, PT, DPT  08/19/2022, 1:11 PM

## 2022-09-03 ENCOUNTER — Ambulatory Visit: Payer: Medicaid Other | Attending: Family

## 2022-09-03 DIAGNOSIS — M25512 Pain in left shoulder: Secondary | ICD-10-CM | POA: Insufficient documentation

## 2022-09-03 DIAGNOSIS — M25511 Pain in right shoulder: Secondary | ICD-10-CM | POA: Diagnosis present

## 2022-09-03 DIAGNOSIS — G8929 Other chronic pain: Secondary | ICD-10-CM

## 2022-09-03 DIAGNOSIS — M5412 Radiculopathy, cervical region: Secondary | ICD-10-CM | POA: Diagnosis present

## 2022-09-03 NOTE — Therapy (Signed)
OUTPATIENT PHYSICAL THERAPY CERVICAL EVALUATION   Patient Name: Jaclyn Cooper MRN: [id number] DOB:02-10-1971, 52 y.o., female Today's Date: 09/03/2022  END OF SESSION:  PT End of Session - 09/03/22 1406     Visit Number 2    Number of Visits 16    PT Start Time 1000    PT Stop Time 1045    PT Time Calculation (min) 45 min    Activity Tolerance Patient limited by pain    Behavior During Therapy [facility] for tasks assessed/performed              Past Medical History:  Diagnosis Date   Breast cancer (HCC) 10/21/2012   Depression 07/31/2020   Past Surgical History:  Procedure Laterality Date   MASTECTOMY Left 05/16/2013   Patient Active Problem List   Diagnosis Date Noted   Thrombocytosis, unspecified 06/12/2022    Class: Chronic   Mass of soft tissue of upper arm 06/11/2022    Class: Diagnosis of   Neck pain on left side 06/11/2022    Class: Chronic   Tobacco abuse 07/31/2021    Class: Chronic   Lymphedema of upper extremity following lymphadenectomy 07/31/2015    Class: Chronic   Malignant neoplasm of upper-outer quadrant of left breast in female, estrogen receptor positive (HCC) 10/21/2012    PCP: Jerrye Bushy, FNP  REFERRING PROVIDER: Donalee Citrin, MD  REFERRING DIAG: Radiculopathy, cervical region [phone]   THERAPY DIAG:  Chronic pain of both shoulders  Radiculopathy, cervical region  Rationale for Evaluation and Treatment: Rehabilitation  ONSET DATE: approx. 1 year  SUBJECTIVE:                                                                                                                                                                                                         SUBJECTIVE STATEMENT: Patient states that she had a lot of pain yesterday in her neck and shoulders. "My family had to help me with some stuff, because I couldn't do it."    PERTINENT HISTORY:  Pmhx of breast cancer, L mastectomy (10 years ago) right-sided foot drop,  diabetes, hypercholesterolemia, HTN   PAIN:  Are you having pain? Yes: NPRS scale: 9/10 Pain location: neck, thoracic/periscap, bilateral shoulders, B arms Pain description: dull, stabbing, burning, numbness, occasional cramps in thoracic/lumbar spine  Aggravating factors: bed mobility, driving with the seatbelt  Relieving factors: laying down, rest   PRECAUTIONS: None  WEIGHT BEARING RESTRICTIONS: No  FALLS:  Has patient fallen in last 6 months? No  LIVING ENVIRONMENT: Lives with: lives with their  family Lives in: House/apartment Stairs: No Has following equipment at home: Single point cane and Walker - 2 wheeled  OCCUPATION: n/a  PLOF: Independent with basic ADLs, help from home health nursing and family for meal preparation  PATIENT GOALS: To have less pain with driving her daughter to school, getting out of bed, and less headaches.   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 She has got normal neurologic exam will try her on some physical therapy and see how she tolerated that I will see her back in 2 months time.   PATIENT SURVEYS:  NDI 80% current  COGNITION: Overall cognitive status: Within functional limits for tasks assessed  SENSATION: Not tested  POSTURE: rounded shoulders and forward head  PALPATION: Tenderness to palpation bilaterally    CERVICAL ROM:   Active ROM A/PROM (deg) eval  Flexion WFL  Extension 30  Right lateral flexion WFL  Left lateral flexion 30  Right rotation 30  Left rotation 60   (Blank rows = not tested)  UPPER EXTREMITY MMT:  MMT Right eval Left eval  Shoulder flexion 4- 4  Shoulder extension    Shoulder abduction 4- 4  Shoulder adduction    Shoulder extension     Shoulder internal rotation    Shoulder external rotation    Middle trapezius    Lower trapezius    Elbow flexion 4 4-  Elbow extension 4 3+  Wrist flexion    Wrist extension    Wrist ulnar deviation    Wrist radial deviation    Wrist pronation    Wrist supination    Grip strength 45# avg (3 trials) 35# avg (3 trials)   (Blank rows = not tested)  [facility] Adult PT Treatment:                                                DATE: 09/03/2022  Therapeutic Exercise: Supine cervical retraction, 3 x 10, 3 sec with slight R side bend  Supine cervical AROM x 5 each following manual therapy   Manual Therapy: STM and myofascial trigger point release along cervical paraspinals, B UT, LS, scalenes, and suboccipitals Passive UT stretch, Passive R median nerve glide    Grade I/II first rib mobilization for pain relief Manual cervical traction   [facility] Adult PT Treatment:                                                DATE: 08/19/2022  Therapeutic Exercise: Supine cervical retraction x 5  Supine cervical AROM x 2 each   PATIENT EDUCATION:  Education details: provided and reviewed HEP Person educated: Patient Education method: Explanation, Demonstration, and Handouts Education comprehension: verbalized understanding, returned demonstration, and needs further education  HOME EXERCISE PROGRAM: Access Code: [email or web address] URL: [email or web address] Date: 08/19/2022 Prepared by: Mauri Reading  Exercises - Supine Cervical Retraction with Towel  - 2-3 x daily - 7 x weekly - 2 sets - 10 reps - 3 sec  hold - Supine Cervical Rotation AROM on Pillow  - 2-3 x daily - 7 x weekly - 2 sets - 10 reps - Seated Scapular Retraction  - 2-3 x daily - 7 x weekly - 2 sets - 10 reps - 3 sec hold  ASSESSMENT:  CLINICAL IMPRESSION: Patient is reporting decreased pain severity from 9/10 to 6/10 at end of session. She had significant tenderness to palpation and palpable muscle turgor throughout cervical  paraspinals, R>L upper trap and other cervical musculature that was reduced following manual therapy. She was able to perform cervical retraction exercises with slight R side bend in supine. Encouraged patient to increase frequency of cervical retraction at home and discussed pain modulation purpose of this exercise.   OBJECTIVE IMPAIRMENTS: decreased activity tolerance, decreased ROM, decreased strength, impaired UE functional use, postural dysfunction, and pain.   ACTIVITY LIMITATIONS: carrying, lifting, sleeping, bed mobility, dressing, and hygiene/grooming  PARTICIPATION LIMITATIONS: meal prep, cleaning, laundry, driving, and community activity  PERSONAL FACTORS: Age, Past/current experiences, Time since onset of injury/illness/exacerbation, and 3+ comorbidities: Pmhx of breast cancer, diabetes, hypercholesterolemia, HTN   are also affecting patient's functional outcome.   REHAB POTENTIAL: Fair   CLINICAL DECISION MAKING: Evolving/moderate complexity  EVALUATION COMPLEXITY: Moderate   GOALS: Goals reviewed with patient? Yes  SHORT TERM GOALS: Target date: 09/16/2022   Patient will have decreased headaches to 4 days/week or less in order to improve QOL.  Baseline: constant, daily headaches Goal status: INITIAL  2.  Patient will be independent with home program to address postural endurance, cervical ROM, and UE strength.  Baseline: initiated at eval Goal status: INITIAL    LONG TERM GOALS: Target date: 10/14/2022   Patient will report ability to drive at least 45 minutes without exacerbation of symptoms in order to transport her daughter to school.  Baseline: constant pain with driving  Goal status: INITIAL  2.  Patient will report ability to centralize and/or decrease her symptoms with her prescribed home program in order to promote independent pain management strategies following discharge from PT.  Goal status: INITIAL  3.  Patient will report ability to perform bed  mobility activities in the morning without exacerbation of symptoms.  Baseline: unable to perform without assistance d/t reported pain and stiffness Goal status: INITIAL  4.  Patient will have increased grip strength to at least 50# bilaterally.  Baseline: see objective findings  Goal status: INITIAL  5.  Patient will demonstrate at least 4+/5 MMT bilaterally in order to improved return to ADLs.  Baseline: see objective findings  Goal status: INITIAL  6.  Patient will have decreased NDI score to 60% or less in order to demonstrate improved tolerance of functional activities and improved overall QOL.  Baseline: 80% Goal status: INITIAL    PLAN:  PT FREQUENCY: 2x/week  PT DURATION: 8 weeks  PLANNED INTERVENTIONS: Therapeutic exercises, Therapeutic activity, Neuromuscular re-education, Patient/Family education, Self Care, Joint mobilization, Dry Needling, Electrical stimulation, Spinal mobilization, Cryotherapy, Moist heat, Taping, Traction, Manual therapy, and Re-evaluation  PLAN FOR NEXT SESSION: review and progress home program as needed. Initiate manual therapy, trial cervical distraction. Postural reeducation and scapular retraction. Progress cervical mobility and strengthening program as appropriate.        Mauri Reading, PT, DPT 09/03/2022, 2:08 PM

## 2022-09-05 ENCOUNTER — Ambulatory Visit: Payer: Medicaid Other

## 2022-09-10 ENCOUNTER — Ambulatory Visit: Payer: Medicaid Other

## 2022-09-10 DIAGNOSIS — G8929 Other chronic pain: Secondary | ICD-10-CM

## 2022-09-10 DIAGNOSIS — M5412 Radiculopathy, cervical region: Secondary | ICD-10-CM

## 2022-09-10 DIAGNOSIS — M25511 Pain in right shoulder: Secondary | ICD-10-CM | POA: Diagnosis not present

## 2022-09-10 NOTE — Therapy (Signed)
OUTPATIENT PHYSICAL THERAPY TREATMENT   Patient Name: Jaclyn Cooper MRN: [id number] DOB:December 23, 1970, 52 y.o., female Today's Date: 09/10/2022  END OF SESSION:  PT End of Session - 09/10/22 1019     Visit Number 3    Number of Visits 16    PT Start Time 1016    PT Stop Time 1045    PT Time Calculation (min) 29 min    Activity Tolerance Patient limited by pain    Behavior During Therapy [facility] for tasks assessed/performed               Past Medical History:  Diagnosis Date   Breast cancer (HCC) 10/21/2012   Depression 07/31/2020   Past Surgical History:  Procedure Laterality Date   MASTECTOMY Left 05/16/2013   Patient Active Problem List   Diagnosis Date Noted   Thrombocytosis, unspecified 06/12/2022    Class: Chronic   Mass of soft tissue of upper arm 06/11/2022    Class: Diagnosis of   Neck pain on left side 06/11/2022    Class: Chronic   Tobacco abuse 07/31/2021    Class: Chronic   Lymphedema of upper extremity following lymphadenectomy 07/31/2015    Class: Chronic   Malignant neoplasm of upper-outer quadrant of left breast in female, 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 1 year  SUBJECTIVE:                                                                                                                                                                                                         SUBJECTIVE STATEMENT: Patient reports that she ran in to the corner of a cabinet and bruised her left shoulder. She feels that it has aggravated her symptoms.    PERTINENT HISTORY:  Pmhx of breast cancer, L mastectomy (10 years ago) right-sided foot drop, diabetes,  hypercholesterolemia, HTN   PAIN:  Are you having pain? Yes: NPRS scale: 9/10 Pain location: neck, thoracic/periscap, bilateral shoulders, B arms Pain description: dull, stabbing, burning, numbness, occasional cramps in thoracic/lumbar spine  Aggravating factors: bed mobility, driving with the seatbelt  Relieving factors: laying down, rest   PRECAUTIONS: None  WEIGHT BEARING RESTRICTIONS: No  FALLS:  Has patient fallen in last 6 months? No  LIVING ENVIRONMENT: Lives with: lives with their family Lives in: House/apartment  Stairs: No Has following equipment at home: Single point cane and Walker - 2 wheeled  OCCUPATION: n/a  PLOF: Independent with basic ADLs, help from home health nursing and family for meal preparation  PATIENT GOALS: To have less pain with driving her daughter to school, getting out of bed, and less headaches.   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 She has got normal neurologic exam will try her on some physical therapy and see how she tolerated that I will see her back in 2 months time.   PATIENT SURVEYS:  NDI 80% current  COGNITION: Overall cognitive status: Within functional limits for tasks assessed  SENSATION: Not tested  POSTURE: rounded shoulders and forward head  PALPATION: Tenderness to palpation bilaterally    CERVICAL ROM:   Active ROM A/PROM (deg) eval  Flexion WFL  Extension 30  Right lateral flexion WFL  Left lateral flexion 30  Right rotation 30  Left rotation 60   (Blank rows = not tested)  UPPER EXTREMITY MMT:  MMT Right eval Left eval  Shoulder flexion 4- 4  Shoulder extension    Shoulder abduction 4- 4  Shoulder adduction    Shoulder extension    Shoulder  internal rotation    Shoulder external rotation    Middle trapezius    Lower trapezius    Elbow flexion 4 4-  Elbow extension 4 3+  Wrist flexion    Wrist extension    Wrist ulnar deviation    Wrist radial deviation    Wrist pronation    Wrist supination    Grip strength 45# avg (3 trials) 35# avg (3 trials)   (Blank rows = not tested)   [facility] Adult PT Treatment:                                                DATE: 09/10/2022   Therapeutic Exercise: Supine cervical retraction, x10  Shoulder Rolls fwd/backwards, x 10 each  UT stretch, 3 x 30 sec bilaterally concurrent with MHP  Manual Therapy: STM and myofascial trigger point release along cervical paraspinals, B UT, LS, scalenes, and suboccipitals  Manual Cervical Traction   Modalities: Moist Heat Pack applied while seated x 10 minutes   [facility] Adult PT Treatment:                                                DATE: 09/03/2022  Therapeutic Exercise: Supine cervical retraction, 3 x 10, 3 sec with slight R side bend  Supine cervical AROM x 5 each following manual therapy   Manual Therapy: STM and myofascial trigger point release along cervical paraspinals, B UT, LS, scalenes, and suboccipitals Passive UT stretch, Passive R median nerve glide    Grade I/II first rib mobilization for pain relief Manual cervical traction   [facility] Adult PT Treatment:  DATE: 08/19/2022  Therapeutic Exercise: Supine cervical retraction x 5  Supine cervical AROM x 2 each   PATIENT EDUCATION:  Education details: provided and reviewed HEP Person educated: Patient Education method: Explanation, Demonstration, and Handouts Education comprehension: verbalized understanding, returned demonstration, and needs further education  HOME EXERCISE PROGRAM: Access Code: [email or web address] URL: [email or web address] Date: 08/19/2022 Prepared by: Mauri Reading  Exercises - Supine Cervical Retraction with  Towel  - 2-3 x daily - 7 x weekly - 2 sets - 10 reps - 3 sec hold - Supine Cervical Rotation AROM on Pillow  - 2-3 x daily - 7 x weekly - 2 sets - 10 reps - Seated Scapular Retraction  - 2-3 x daily - 7 x weekly - 2 sets - 10 reps - 3 sec hold  ASSESSMENT:  CLINICAL IMPRESSION: Panya had a shortened treatment session today d/t arriving late due to encountering heavy rain on her >40 minute commute to our clinic. She had difficulty tolerating manual therapy and previous exercises today due to severe tenderness to palpation, including palpable mm spasms with light-to-medium pressure at myofascial trigger points along bilateral upper trap mm. She had some relief following application of moist heat pack at end of session and mobility exercises. Encouraged patient to apply moist heat pack when arriving home and perform her mobility exercises to decrease severity of symptoms today.    OBJECTIVE IMPAIRMENTS: decreased activity tolerance, decreased ROM, decreased strength, impaired UE functional use, postural dysfunction, and pain.   ACTIVITY LIMITATIONS: carrying, lifting, sleeping, bed mobility, dressing, and hygiene/grooming  PARTICIPATION LIMITATIONS: meal prep, cleaning, laundry, driving, and community activity  PERSONAL FACTORS: Age, Past/current experiences, Time since onset of injury/illness/exacerbation, and 3+ comorbidities: Pmhx of breast cancer, diabetes, hypercholesterolemia, HTN   are also affecting patient's functional outcome.   REHAB POTENTIAL: Fair   CLINICAL DECISION MAKING: Evolving/moderate complexity  EVALUATION COMPLEXITY: Moderate   GOALS: Goals reviewed with patient? Yes  SHORT TERM GOALS: Target date: 09/16/2022   Patient will have decreased headaches to 4 days/week or less in order to improve QOL.  Baseline: constant, daily headaches Goal status: INITIAL  2.  Patient will be independent with home program to address postural endurance, cervical ROM, and UE strength.   Baseline: initiated at eval Goal status: INITIAL    LONG TERM GOALS: Target date: 10/14/2022   Patient will report ability to drive at least 45 minutes without exacerbation of symptoms in order to transport her daughter to school.  Baseline: constant pain with driving  Goal status: INITIAL  2.  Patient will report ability to centralize and/or decrease her symptoms with her prescribed home program in order to promote independent pain management strategies following discharge from PT.  Goal status: INITIAL  3.  Patient will report ability to perform bed mobility activities in the morning without exacerbation of symptoms.  Baseline: unable to perform without assistance d/t reported pain and stiffness Goal status: INITIAL  4.  Patient will have increased grip strength to at least 50# bilaterally.  Baseline: see objective findings  Goal status: INITIAL  5.  Patient will demonstrate at least 4+/5 MMT bilaterally in order to improved return to ADLs.  Baseline: see objective findings  Goal status: INITIAL  6.  Patient will have decreased NDI score to 60% or less in order to demonstrate improved tolerance of functional activities and improved overall QOL.  Baseline: 80% Goal status: INITIAL    PLAN:  PT FREQUENCY: 2x/week  PT DURATION: 8 weeks  PLANNED INTERVENTIONS: Therapeutic exercises,  Therapeutic activity, Neuromuscular re-education, Patient/Family education, Self Care, Joint mobilization, Dry Needling, Electrical stimulation, Spinal mobilization, Cryotherapy, Moist heat, Taping, Traction, Manual therapy, and Re-evaluation  PLAN FOR NEXT SESSION: review and progress home program as needed. Initiate manual therapy, trial cervical distraction. Postural reeducation and scapular retraction. Progress cervical mobility and strengthening program as appropriate.        Mauri Reading, PT, DPT 09/10/2022, 3:14 PM

## 2022-09-12 ENCOUNTER — Ambulatory Visit: Payer: Medicaid Other

## 2022-09-12 DIAGNOSIS — G8929 Other chronic pain: Secondary | ICD-10-CM

## 2022-09-12 DIAGNOSIS — M5412 Radiculopathy, cervical region: Secondary | ICD-10-CM

## 2022-09-12 DIAGNOSIS — M25511 Pain in right shoulder: Secondary | ICD-10-CM | POA: Diagnosis not present

## 2022-09-12 NOTE — Therapy (Signed)
OUTPATIENT PHYSICAL THERAPY TREATMENT   Patient Name: Jaclyn Cooper MRN: [id number] DOB:1970/07/08, 52 y.o., female Today's Date: 09/12/2022  END OF SESSION:  PT End of Session - 09/12/22 0917     Visit Number 4    Number of Visits 16    PT Start Time 0917    PT Stop Time 1000    PT Time Calculation (min) 43 min    Activity Tolerance Patient limited by pain    Behavior During Therapy [employer] for tasks assessed/performed                Past Medical History:  Diagnosis Date   Breast cancer (HCC) 10/21/2012   Depression 07/31/2020   Past Surgical History:  Procedure Laterality Date   MASTECTOMY Left 05/16/2013   Patient Active Problem List   Diagnosis Date Noted   Thrombocytosis, unspecified 06/12/2022    Class: Chronic   Mass of soft tissue of upper arm 06/11/2022    Class: Diagnosis of   Neck pain on left side 06/11/2022    Class: Chronic   Tobacco abuse 07/31/2021    Class: Chronic   Lymphedema of upper extremity following lymphadenectomy 07/31/2015    Class: Chronic   Malignant neoplasm of upper-outer quadrant of left breast in female, estrogen receptor positive (HCC) 10/21/2012    PCP: Jerrye Bushy, FNP  REFERRING PROVIDER: Donalee Citrin, MD  REFERRING DIAG: Radiculopathy, cervical region [phone]   THERAPY DIAG:  Chronic pain of both shoulders  Radiculopathy, cervical region  Rationale for Evaluation and Treatment: Rehabilitation  ONSET DATE: approx. 1 year  SUBJECTIVE:                                                                                                                                                                                                         SUBJECTIVE STATEMENT: Patient reports that she is feeling slightly better than her last visit.    PERTINENT HISTORY:  Pmhx of breast cancer, L mastectomy (10 years ago) right-sided foot drop, diabetes, hypercholesterolemia, HTN   PAIN:  Are you having pain? Yes: NPRS  scale: 9/10 Pain location: neck, thoracic/periscap, bilateral shoulders, B arms Pain description: dull, stabbing, burning, numbness, occasional cramps in thoracic/lumbar spine  Aggravating factors: bed mobility, driving with the seatbelt  Relieving factors: laying down, rest   PRECAUTIONS: None  WEIGHT BEARING RESTRICTIONS: No  FALLS:  Has patient fallen in last 6 months? No  LIVING ENVIRONMENT: Lives with: lives with their family Lives in: House/apartment Stairs: No Has following equipment at home: Single point cane and Environmental consultant -  2 wheeled  OCCUPATION: n/a  PLOF: Independent with basic ADLs, help from home health nursing and family for meal preparation  PATIENT GOALS: To have less pain with driving her daughter to school, getting out of bed, and less headaches.   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 She has got normal neurologic exam will try her on some physical therapy and see how she tolerated that I will see her back in 2 months time.   PATIENT SURVEYS:  NDI 80% current  COGNITION: Overall cognitive status: Within functional limits for tasks assessed  SENSATION: Not tested  POSTURE: rounded shoulders and forward head  PALPATION: Tenderness to palpation bilaterally    CERVICAL ROM:   Active ROM A/PROM (deg) eval  Flexion WFL  Extension 30  Right lateral flexion WFL  Left lateral flexion 30  Right rotation 30  Left rotation 60   (Blank rows = not tested)  UPPER EXTREMITY MMT:  MMT Right eval Left eval  Shoulder flexion 4- 4  Shoulder extension    Shoulder abduction 4- 4  Shoulder adduction    Shoulder extension    Shoulder internal rotation    Shoulder external rotation    Middle trapezius     Lower trapezius    Elbow flexion 4 4-  Elbow extension 4 3+  Wrist flexion    Wrist extension    Wrist ulnar deviation    Wrist radial deviation    Wrist pronation    Wrist supination    Grip strength 45# avg (3 trials) 35# avg (3 trials)   (Blank rows = not tested)  [facility] Adult PT Treatment:                                                DATE: 09/12/2022  Therapeutic Exercise: Seated cervical retraction, 2x10 with small wall against wall Shoulder Rolls backwards, x 10  Scapular retraction x 10  UBE level 1, fwd/back, 2 min each  Physioball rolls 3-way x 5 each way, 5 sec hold   Manual Therapy: STM and myofascial trigger point release along cervical paraspinals, B UT, LS, scalenes, and suboccipitals  Manual Cervical Traction  Grade 1 first rib mob  Modalities: Moist Heat Pack applied while supine x 8 minutes    [facility] Adult PT Treatment:                                                DATE: 09/10/2022   Therapeutic Exercise: Supine cervical retraction, x10  Shoulder Rolls fwd/backwards, x 10 each  UT stretch, 3 x 30 sec bilaterally concurrent with MHP  Manual Therapy: STM and myofascial trigger point release along cervical paraspinals, B UT, LS, scalenes, and suboccipitals  Manual Cervical Traction   Modalities: Moist Heat Pack applied while seated x 10 minutes   [facility] Adult PT Treatment:  DATE: 09/03/2022  Therapeutic Exercise: Supine cervical retraction, 3 x 10, 3 sec with slight R side bend  Supine cervical AROM x 5 each following manual therapy   Manual Therapy: STM and myofascial trigger point release along cervical paraspinals, B UT, LS, scalenes, and suboccipitals Passive UT stretch, Passive R median nerve glide    Grade I/II first rib mobilization for pain relief Manual cervical traction    PATIENT EDUCATION:  Education details: provided and reviewed HEP Person educated: Patient Education method: Explanation,  Demonstration, and Handouts Education comprehension: verbalized understanding, returned demonstration, and needs further education  HOME EXERCISE PROGRAM: Access Code: [email or web address] URL: [email or web address] Date: 08/19/2022 Prepared by: Mauri Reading  Exercises - Supine Cervical Retraction with Towel  - 2-3 x daily - 7 x weekly - 2 sets - 10 reps - 3 sec hold - Supine Cervical Rotation AROM on Pillow  - 2-3 x daily - 7 x weekly - 2 sets - 10 reps - Seated Scapular Retraction  - 2-3 x daily - 7 x weekly - 2 sets - 10 reps - 3 sec hold  ASSESSMENT:  CLINICAL IMPRESSION: Akeena was able to tolerate more exercises today following modalities and manual therapy. She continues to have increased muscle tension and moderate-to-severe tenderness to palpation throughout cervical and thoracic region. We will continue with mobility activities as tolerated.     OBJECTIVE IMPAIRMENTS: decreased activity tolerance, decreased ROM, decreased strength, impaired UE functional use, postural dysfunction, and pain.   ACTIVITY LIMITATIONS: carrying, lifting, sleeping, bed mobility, dressing, and hygiene/grooming  PARTICIPATION LIMITATIONS: meal prep, cleaning, laundry, driving, and community activity  PERSONAL FACTORS: Age, Past/current experiences, Time since onset of injury/illness/exacerbation, and 3+ comorbidities: Pmhx of breast cancer, diabetes, hypercholesterolemia, HTN   are also affecting patient's functional outcome.   REHAB POTENTIAL: Fair   CLINICAL DECISION MAKING: Evolving/moderate complexity  EVALUATION COMPLEXITY: Moderate   GOALS: Goals reviewed with patient? Yes  SHORT TERM GOALS: Target date: 09/16/2022   Patient will have decreased headaches to 4 days/week or less in order to improve QOL.  Baseline: constant, daily headaches Goal status: INITIAL  2.  Patient will be independent with home program to address postural endurance, cervical ROM, and UE strength.   Baseline: initiated at eval Goal status: INITIAL    LONG TERM GOALS: Target date: 10/14/2022   Patient will report ability to drive at least 45 minutes without exacerbation of symptoms in order to transport her daughter to school.  Baseline: constant pain with driving  Goal status: INITIAL  2.  Patient will report ability to centralize and/or decrease her symptoms with her prescribed home program in order to promote independent pain management strategies following discharge from PT.  Goal status: INITIAL  3.  Patient will report ability to perform bed mobility activities in the morning without exacerbation of symptoms.  Baseline: unable to perform without assistance d/t reported pain and stiffness Goal status: INITIAL  4.  Patient will have increased grip strength to at least 50# bilaterally.  Baseline: see objective findings  Goal status: INITIAL  5.  Patient will demonstrate at least 4+/5 MMT bilaterally in order to improved return to ADLs.  Baseline: see objective findings  Goal status: INITIAL  6.  Patient will have decreased NDI score to 60% or less in order to demonstrate improved tolerance of functional activities and improved overall QOL.  Baseline: 80% Goal status: INITIAL    PLAN:  PT FREQUENCY: 2x/week  PT DURATION: 8 weeks  PLANNED INTERVENTIONS: Therapeutic exercises, Therapeutic  activity, Neuromuscular re-education, Patient/Family education, Self Care, Joint mobilization, Dry Needling, Electrical stimulation, Spinal mobilization, Cryotherapy, Moist heat, Taping, Traction, Manual therapy, and Re-evaluation  PLAN FOR NEXT SESSION: review and progress home program as needed. Initiate manual therapy, trial cervical distraction. Postural reeducation and scapular retraction. Progress cervical mobility and strengthening program as appropriate.        Mauri Reading, PT, DPT 09/12/2022, 10:28 AM

## 2022-09-17 ENCOUNTER — Ambulatory Visit: Payer: Medicaid Other

## 2022-09-17 DIAGNOSIS — M5412 Radiculopathy, cervical region: Secondary | ICD-10-CM

## 2022-09-17 DIAGNOSIS — G8929 Other chronic pain: Secondary | ICD-10-CM

## 2022-09-17 DIAGNOSIS — M25511 Pain in right shoulder: Secondary | ICD-10-CM | POA: Diagnosis not present

## 2022-09-17 NOTE — Therapy (Signed)
OUTPATIENT PHYSICAL THERAPY TREATMENT   Patient Name: Jaclyn Cooper MRN: [id number] DOB:1970/12/10, 52 y.o., female Today's Date: 09/17/2022  END OF SESSION:  PT End of Session - 09/17/22 1047     Visit Number 5    Number of Visits 16    Date for PT Re-Evaluation 10/14/22    PT Start Time 1045    PT Stop Time 1130    PT Time Calculation (min) 45 min    Activity Tolerance Patient limited by pain    Behavior During Therapy [facility] for tasks assessed/performed                 Past Medical History:  Diagnosis Date   Breast cancer (HCC) 10/21/2012   Depression 07/31/2020   Past Surgical History:  Procedure Laterality Date   MASTECTOMY Left 05/16/2013   Patient Active Problem List   Diagnosis Date Noted   Thrombocytosis, unspecified 06/12/2022    Class: Chronic   Mass of soft tissue of upper arm 06/11/2022    Class: Diagnosis of   Neck pain on left side 06/11/2022    Class: Chronic   Tobacco abuse 07/31/2021    Class: Chronic   Lymphedema of upper extremity following lymphadenectomy 07/31/2015    Class: Chronic   Malignant neoplasm of upper-outer quadrant of left breast in female, 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 1 year  SUBJECTIVE:                                                                                                                                                                                                         SUBJECTIVE STATEMENT: Patient reports that she is feeling somewhat better today than the past few weeks.    PERTINENT HISTORY:  Pmhx of breast cancer, L mastectomy (10 years ago) right-sided foot drop, diabetes, hypercholesterolemia,  HTN   PAIN:  Are you having pain? Yes: NPRS scale: 9/10 Pain location: neck, thoracic/periscap, bilateral shoulders, B arms Pain description: dull, stabbing, burning, numbness, occasional cramps in thoracic/lumbar spine  Aggravating factors: bed mobility, driving with the seatbelt  Relieving factors: laying down, rest   PRECAUTIONS: None  WEIGHT BEARING RESTRICTIONS: No  FALLS:  Has patient fallen in last 6 months? No  LIVING ENVIRONMENT: Lives with: lives with their family Lives in: House/apartment Stairs:  No Has following equipment at home: Single point cane and Walker - 2 wheeled  OCCUPATION: n/a  PLOF: Independent with basic ADLs, help from home health nursing and family for meal preparation  PATIENT GOALS: To have less pain with driving her daughter to school, getting out of bed, and less headaches.   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 She has got normal neurologic exam will try her on some physical therapy and see how she tolerated that I will see her back in 2 months time.   PATIENT SURVEYS:  NDI 80% current  COGNITION: Overall cognitive status: Within functional limits for tasks assessed  SENSATION: Not tested  POSTURE: rounded shoulders and forward head  PALPATION: Tenderness to palpation bilaterally    CERVICAL ROM:   Active ROM A/PROM (deg) eval  Flexion WFL  Extension 30  Right lateral flexion WFL  Left lateral flexion 30  Right rotation 30  Left rotation 60   (Blank rows = not tested)  UPPER EXTREMITY MMT:  MMT Right eval Left eval  Shoulder flexion 4- 4  Shoulder extension    Shoulder abduction 4- 4  Shoulder adduction    Shoulder extension    Shoulder internal rotation     Shoulder external rotation    Middle trapezius    Lower trapezius    Elbow flexion 4 4-  Elbow extension 4 3+  Wrist flexion    Wrist extension    Wrist ulnar deviation    Wrist radial deviation    Wrist pronation    Wrist supination    Grip strength 45# avg (3 trials) 35# avg (3 trials)   (Blank rows = not tested)   [facility] Adult PT Treatment:                                                DATE: 09/17/2022  Therapeutic Exercise: Supine cervical retraction, x15  Shoulder Rolls backwards, x 20  Scapular retraction 3 sec hold, 2 x 10  UBE level 1, fwd/back, 3 min each  Physioball rolls 3-way x 5 each way, 5 sec hold  Self-SNAG, x 5 each direction   UT stretch, 2 x 30 sec each   Manual Therapy: STM and myofascial trigger point release along cervical paraspinals, B UT, LS, scalenes, and suboccipitals  Manual Cervical Traction  Grade 1 first rib mob  Modalities: Moist Heat Pack applied while supine x 8 minutes     [facility] Adult PT Treatment:                                                DATE: 09/12/2022  Therapeutic Exercise: Seated cervical retraction, 2x10 with small wall against wall Shoulder Rolls backwards, x 10  Scapular retraction x 10  UBE level 1, fwd/back, 2 min each  Physioball rolls 3-way x 5 each way, 5 sec hold   Manual Therapy: STM and myofascial trigger point release along cervical paraspinals, B UT, LS, scalenes, and  suboccipitals  Manual Cervical Traction  Grade 1 first rib mob  Modalities: Moist Heat Pack applied while supine x 8 minutes    [facility] Adult PT Treatment:                                                DATE: 09/10/2022   Therapeutic Exercise: Supine cervical retraction, x10  Shoulder Rolls fwd/backwards, x 10 each  UT stretch, 3 x 30 sec bilaterally concurrent with MHP  Manual Therapy: STM and myofascial trigger point release along cervical paraspinals, B UT, LS, scalenes, and suboccipitals  Manual Cervical Traction    Modalities: Moist Heat Pack applied while seated x 10 minutes     PATIENT EDUCATION:  Education details: provided and reviewed HEP Person educated: Patient Education method: Explanation, Demonstration, and Handouts Education comprehension: verbalized understanding, returned demonstration, and needs further education  HOME EXERCISE PROGRAM: Access Code: [email or web address] URL: [email or web address] Date: 09/17/2022 Prepared by: Mauri Reading  Exercises - Supine Cervical Retraction with Towel  - 2-3 x daily - 7 x weekly - 2 sets - 10 reps - 3 sec hold - Supine Cervical Rotation AROM on Pillow  - 2-3 x daily - 7 x weekly - 2 sets - 10 reps - Seated Scapular Retraction  - 2-3 x daily - 7 x weekly - 2 sets - 10 reps - 3 sec hold - Seated Assisted Cervical Rotation with Towel  - 2-3 x daily - 7 x weekly - 1 sets - 10 reps - 3 sec hold  ASSESSMENT:  CLINICAL IMPRESSION: Patient was able to perform more exercises today. We will begin to wean manual therapy as able. She responded well to cervical mobility activities today. We will continue to progress as able.    OBJECTIVE IMPAIRMENTS: decreased activity tolerance, decreased ROM, decreased strength, impaired UE functional use, postural dysfunction, and pain.   ACTIVITY LIMITATIONS: carrying, lifting, sleeping, bed mobility, dressing, and hygiene/grooming  PARTICIPATION LIMITATIONS: meal prep, cleaning, laundry, driving, and community activity  PERSONAL FACTORS: Age, Past/current experiences, Time since onset of injury/illness/exacerbation, and 3+ comorbidities: Pmhx of breast cancer, diabetes, hypercholesterolemia, HTN   are also affecting patient's functional outcome.   REHAB POTENTIAL: Fair   CLINICAL DECISION MAKING: Evolving/moderate complexity  EVALUATION COMPLEXITY: Moderate   GOALS: Goals reviewed with patient? Yes  SHORT TERM GOALS: Target date: 09/16/2022   Patient will have decreased headaches to 4 days/week  or less in order to improve QOL.  Baseline: constant, daily headaches Goal status: INITIAL  2.  Patient will be independent with home program to address postural endurance, cervical ROM, and UE strength.  Baseline: initiated at eval Goal status: INITIAL    LONG TERM GOALS: Target date: 10/14/2022   Patient will report ability to drive at least 45 minutes without exacerbation of symptoms in order to transport her daughter to school.  Baseline: constant pain with driving  Goal status: INITIAL  2.  Patient will report ability to centralize and/or decrease her symptoms with her prescribed home program in order to promote independent pain management strategies following discharge from PT.  Goal status: INITIAL  3.  Patient will report ability to perform bed mobility activities in the morning without exacerbation of symptoms.  Baseline: unable to perform without assistance d/t reported pain and stiffness Goal status: INITIAL  4.  Patient will have increased grip strength to at least  50# bilaterally.  Baseline: see objective findings  Goal status: INITIAL  5.  Patient will demonstrate at least 4+/5 MMT bilaterally in order to improved return to ADLs.  Baseline: see objective findings  Goal status: INITIAL  6.  Patient will have decreased NDI score to 60% or less in order to demonstrate improved tolerance of functional activities and improved overall QOL.  Baseline: 80% Goal status: INITIAL    PLAN:  PT FREQUENCY: 2x/week  PT DURATION: 8 weeks  PLANNED INTERVENTIONS: Therapeutic exercises, Therapeutic activity, Neuromuscular re-education, Patient/Family education, Self Care, Joint mobilization, Dry Needling, Electrical stimulation, Spinal mobilization, Cryotherapy, Moist heat, Taping, Traction, Manual therapy, and Re-evaluation  PLAN FOR NEXT SESSION: review and progress home program as needed. Initiate manual therapy, trial cervical distraction. Postural reeducation and scapular  retraction. Progress cervical mobility and strengthening program as appropriate.        Mauri Reading, PT, DPT 09/17/2022, 11:34 AM

## 2022-09-19 ENCOUNTER — Ambulatory Visit: Payer: Medicaid Other

## 2022-09-19 DIAGNOSIS — G8929 Other chronic pain: Secondary | ICD-10-CM

## 2022-09-19 DIAGNOSIS — M25511 Pain in right shoulder: Secondary | ICD-10-CM | POA: Diagnosis not present

## 2022-09-19 DIAGNOSIS — M5412 Radiculopathy, cervical region: Secondary | ICD-10-CM

## 2022-09-19 NOTE — Therapy (Signed)
OUTPATIENT PHYSICAL THERAPY TREATMENT   Patient Name: Jaclyn Cooper MRN: [id number] DOB:01-20-1971, 52 y.o., female Today's Date: 09/19/2022  END OF SESSION:  PT End of Session - 09/19/22 1144     Visit Number 6    Number of Visits 16    Date for PT Re-Evaluation 10/14/22    PT Start Time 1045    PT Stop Time 1130    PT Time Calculation (min) 45 min    Activity Tolerance Patient tolerated treatment well    Behavior During Therapy [facility] for tasks assessed/performed                  Past Medical History:  Diagnosis Date   Breast cancer (HCC) 10/21/2012   Depression 07/31/2020   Past Surgical History:  Procedure Laterality Date   MASTECTOMY Left 05/16/2013   Patient Active Problem List   Diagnosis Date Noted   Thrombocytosis, unspecified 06/12/2022    Class: Chronic   Mass of soft tissue of upper arm 06/11/2022    Class: Diagnosis of   Neck pain on left side 06/11/2022    Class: Chronic   Tobacco abuse 07/31/2021    Class: Chronic   Lymphedema of upper extremity following lymphadenectomy 07/31/2015    Class: Chronic   Malignant neoplasm of upper-outer quadrant of left breast in female, estrogen receptor positive (HCC) 10/21/2012    PCP: Jerrye Bushy, FNP  REFERRING PROVIDER: Donalee Citrin, MD  REFERRING DIAG: Radiculopathy, cervical region [phone]   THERAPY DIAG:  Chronic pain of both shoulders  Radiculopathy, cervical region  Rationale for Evaluation and Treatment: Rehabilitation  ONSET DATE: approx. 1 year  SUBJECTIVE:                                                                                                                                                                                                         SUBJECTIVE STATEMENT: Patient has reports that she is feeling okay today. She still feels limited with L arm soreness and heaviness PMHx and running into the counter a couple of weeks ago. However, she agrees that the L arm  swelling has resolved.    PERTINENT HISTORY:  Pmhx of breast cancer, L mastectomy (10 years ago) right-sided foot drop, diabetes, hypercholesterolemia, HTN   PAIN:  Are you having pain? Yes: NPRS scale: 7-8/10 Pain location: neck, thoracic/periscap, bilateral shoulders, B arms Pain description: dull, stabbing, burning, numbness, occasional cramps in thoracic/lumbar spine  Aggravating factors: bed mobility, driving with the seatbelt  Relieving factors: laying down, rest   PRECAUTIONS: None  WEIGHT  BEARING RESTRICTIONS: No  FALLS:  Has patient fallen in last 6 months? No  LIVING ENVIRONMENT: Lives with: lives with their family Lives in: House/apartment Stairs: No Has following equipment at home: Single point cane and Environmental consultant - 2 wheeled  OCCUPATION: n/a  PLOF: Independent with basic ADLs, help from home health nursing and family for meal preparation  PATIENT GOALS: To have less pain with driving her daughter to school, getting out of bed, and less headaches.   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 She has got normal neurologic exam will try her on some physical therapy and see how she tolerated that I will see her back in 2 months time.   PATIENT SURVEYS:  NDI 80% current  COGNITION: Overall cognitive status: Within functional limits for tasks assessed  SENSATION: Not tested  POSTURE: rounded shoulders and forward head  PALPATION: Tenderness to palpation bilaterally    CERVICAL ROM:   Active ROM A/PROM (deg) eval  Flexion WFL  Extension 30  Right lateral flexion WFL  Left lateral flexion 30  Right rotation 30  Left rotation 60   (Blank rows = not tested)  UPPER EXTREMITY MMT:  MMT Right eval  Left eval  Shoulder flexion 4- 4  Shoulder extension    Shoulder abduction 4- 4  Shoulder adduction    Shoulder extension    Shoulder internal rotation    Shoulder external rotation    Middle trapezius    Lower trapezius    Elbow flexion 4 4-  Elbow extension 4 3+  Wrist flexion    Wrist extension    Wrist ulnar deviation    Wrist radial deviation    Wrist pronation    Wrist supination    Grip strength 45# avg (3 trials) 35# avg (3 trials)   (Blank rows = not tested)  [facility] Adult PT Treatment:                                                DATE: 09/19/2022  Therapeutic Exercise: UBE level 1, fwd/back, 3 min each  Resisted Rows GTB x 20  Shoulder ext/Pull downs GTB x 20  Horizontal abduction GTB 2 x 10 Bilateral shoulder ER, GTB 2 x 10  Reviewed HEP and importance of compliance with home program   Manual Therapy: STM and myofascial trigger point release along cervical paraspinals, B UT, LS, scalenes, and suboccipitals  Manual Cervical Traction  Grade 1 first rib mob   [facility] Adult PT Treatment:                                                DATE: 09/17/2022  Therapeutic Exercise: Supine cervical retraction, x15  Shoulder Rolls backwards, x 20  Scapular retraction 3 sec hold, 2 x 10  UBE level 1, fwd/back, 3 min each  Physioball rolls 3-way x 5 each way, 5 sec hold  Self-SNAG, x 5 each direction   UT stretch, 2 x 30 sec  each   Manual Therapy: STM and myofascial trigger point release along cervical paraspinals, B UT, LS, scalenes, and suboccipitals  Manual Cervical Traction  Grade 1 first rib mob  Modalities: Moist Heat Pack applied while supine x 8 minutes     [facility] Adult PT Treatment:                                                DATE: 09/12/2022  Therapeutic Exercise: Seated cervical retraction, 2x10 with small wall against wall Shoulder Rolls backwards, x 10  Scapular retraction x 10  UBE level 1, fwd/back, 2 min each  Physioball rolls 3-way x 5 each way,  5 sec hold   Manual Therapy: STM and myofascial trigger point release along cervical paraspinals, B UT, LS, scalenes, and suboccipitals  Manual Cervical Traction  Grade 1 first rib mob  Modalities: Moist Heat Pack applied while supine x 8 minutes      PATIENT EDUCATION:  Education details: provided and reviewed HEP Person educated: Patient Education method: Explanation, Demonstration, and Handouts Education comprehension: verbalized understanding, returned demonstration, and needs further education  HOME EXERCISE PROGRAM: Access Code: [email or web address] URL: [email or web address] Date: 09/17/2022 Prepared by: Mauri Reading  Exercises - Supine Cervical Retraction with Towel  - 2-3 x daily - 7 x weekly - 2 sets - 10 reps - 3 sec hold - Supine Cervical Rotation AROM on Pillow  - 2-3 x daily - 7 x weekly - 2 sets - 10 reps - Seated Scapular Retraction  - 2-3 x daily - 7 x weekly - 2 sets - 10 reps - 3 sec hold - Seated Assisted Cervical Rotation with Towel  - 2-3 x daily - 7 x weekly - 1 sets - 10 reps - 3 sec hold  ASSESSMENT:  CLINICAL IMPRESSION: Janaya was able to return to periscapular and shoulder strengthening program today without exacerbation of pain. She continues to respond well to manual therapy and reports decreased symptoms at end of session. We discussed importance and compliance with home program to maintain progress and develop independence with therapeutic exercises in preparation for discharge from skilled PT.    OBJECTIVE IMPAIRMENTS: decreased activity tolerance, decreased ROM, decreased strength, impaired UE functional use, postural dysfunction, and pain.   ACTIVITY LIMITATIONS: carrying, lifting, sleeping, bed mobility, dressing, and hygiene/grooming  PARTICIPATION LIMITATIONS: meal prep, cleaning, laundry, driving, and community activity  PERSONAL FACTORS: Age, Past/current experiences, Time since onset of injury/illness/exacerbation, and 3+  comorbidities: Pmhx of breast cancer, diabetes, hypercholesterolemia, HTN   are also affecting patient's functional outcome.   REHAB POTENTIAL: Fair   CLINICAL DECISION MAKING: Evolving/moderate complexity  EVALUATION COMPLEXITY: Moderate   GOALS: Goals reviewed with patient? Yes  SHORT TERM GOALS: Target date: 09/16/2022   Patient will have decreased headaches to 4 days/week or less in order to improve QOL.  Baseline: constant, daily headaches Goal status: INITIAL  2.  Patient will be independent with home program to address postural endurance, cervical ROM, and UE strength.  Baseline: initiated at eval Goal status: INITIAL    LONG TERM GOALS: Target date: 10/14/2022   Patient will report ability to drive at least 45 minutes without exacerbation of symptoms in order to transport her daughter to school.  Baseline: constant pain with driving  Goal status: INITIAL  2.  Patient will report ability to centralize and/or decrease her symptoms with  her prescribed home program in order to promote independent pain management strategies following discharge from PT.  Goal status: INITIAL  3.  Patient will report ability to perform bed mobility activities in the morning without exacerbation of symptoms.  Baseline: unable to perform without assistance d/t reported pain and stiffness Goal status: INITIAL  4.  Patient will have increased grip strength to at least 50# bilaterally.  Baseline: see objective findings  Goal status: INITIAL  5.  Patient will demonstrate at least 4+/5 MMT bilaterally in order to improved return to ADLs.  Baseline: see objective findings  Goal status: INITIAL  6.  Patient will have decreased NDI score to 60% or less in order to demonstrate improved tolerance of functional activities and improved overall QOL.  Baseline: 80% Goal status: INITIAL    PLAN:  PT FREQUENCY: 2x/week  PT DURATION: 8 weeks  PLANNED INTERVENTIONS: Therapeutic exercises,  Therapeutic activity, Neuromuscular re-education, Patient/Family education, Self Care, Joint mobilization, Dry Needling, Electrical stimulation, Spinal mobilization, Cryotherapy, Moist heat, Taping, Traction, Manual therapy, and Re-evaluation  PLAN FOR NEXT SESSION: review and progress home program as needed. Initiate manual therapy, trial cervical distraction. Postural reeducation and scapular retraction. Progress cervical mobility and strengthening program as appropriate.        Mauri Reading, PT, DPT 09/19/2022, 11:51 AM

## 2022-09-24 ENCOUNTER — Ambulatory Visit: Payer: Medicaid Other

## 2022-09-24 DIAGNOSIS — G8929 Other chronic pain: Secondary | ICD-10-CM

## 2022-09-24 DIAGNOSIS — M5412 Radiculopathy, cervical region: Secondary | ICD-10-CM

## 2022-09-24 DIAGNOSIS — M25511 Pain in right shoulder: Secondary | ICD-10-CM | POA: Diagnosis not present

## 2022-09-24 NOTE — Therapy (Signed)
OUTPATIENT PHYSICAL THERAPY TREATMENT   Patient Name: Jaclyn Cooper MRN: [id number] DOB:06/29/1970, 52 y.o., female Today's Date: 09/24/2022  END OF SESSION:  PT End of Session - 09/24/22 1011     Visit Number 7    Number of Visits 16    Date for PT Re-Evaluation 10/14/22    PT Start Time 1006    PT Stop Time 1045    PT Time Calculation (min) 39 min    Activity Tolerance Patient tolerated treatment well    Behavior During Therapy [facility] for tasks assessed/performed                   Past Medical History:  Diagnosis Date   Breast cancer (HCC) 10/21/2012   Depression 07/31/2020   Past Surgical History:  Procedure Laterality Date   MASTECTOMY Left 05/16/2013   Patient Active Problem List   Diagnosis Date Noted   Thrombocytosis, unspecified 06/12/2022    Class: Chronic   Mass of soft tissue of upper arm 06/11/2022    Class: Diagnosis of   Neck pain on left side 06/11/2022    Class: Chronic   Tobacco abuse 07/31/2021    Class: Chronic   Lymphedema of upper extremity following lymphadenectomy 07/31/2015    Class: Chronic   Malignant neoplasm of upper-outer quadrant of left breast in female, 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 1 year  SUBJECTIVE:                                                                                                                                                                                                         SUBJECTIVE STATEMENT: Patient states that she has improved pain levels today.    PERTINENT HISTORY:  Pmhx of breast cancer, L mastectomy (10 years ago) right-sided foot drop, diabetes, hypercholesterolemia, HTN   PAIN:   Are you having pain? Yes: NPRS scale: 6/10 Pain location: neck, thoracic/periscap, bilateral shoulders, B arms Pain description: dull, stabbing, burning, numbness, occasional cramps in thoracic/lumbar spine  Aggravating factors: bed mobility, driving with the seatbelt  Relieving factors: laying down, rest   PRECAUTIONS: None  WEIGHT BEARING RESTRICTIONS: No  FALLS:  Has patient fallen in last 6 months? No  LIVING ENVIRONMENT: Lives with: lives with their family Lives in: House/apartment Stairs: No Has following  equipment at home: Single point cane and Walker - 2 wheeled  OCCUPATION: n/a  PLOF: Independent with basic ADLs, help from home health nursing and family for meal preparation  PATIENT GOALS: To have less pain with driving her daughter to school, getting out of bed, and less headaches.   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 She has got normal neurologic exam will try her on some physical therapy and see how she tolerated that I will see her back in 2 months time.   PATIENT SURVEYS:  NDI 80% current  COGNITION: Overall cognitive status: Within functional limits for tasks assessed  SENSATION: Not tested  POSTURE: rounded shoulders and forward head  PALPATION: Tenderness to palpation bilaterally    CERVICAL ROM:   Active ROM A/PROM (deg) eval  Flexion WFL  Extension 30  Right lateral flexion WFL  Left lateral flexion 30  Right rotation 30  Left rotation 60   (Blank rows = not tested)  UPPER EXTREMITY MMT:  MMT Right eval Left eval  Shoulder flexion 4- 4  Shoulder extension    Shoulder abduction 4- 4  Shoulder adduction    Shoulder extension    Shoulder internal rotation    Shoulder external  rotation    Middle trapezius    Lower trapezius    Elbow flexion 4 4-  Elbow extension 4 3+  Wrist flexion    Wrist extension    Wrist ulnar deviation    Wrist radial deviation    Wrist pronation    Wrist supination    Grip strength 45# avg (3 trials) 35# avg (3 trials)   (Blank rows = not tested)    [facility] Adult PT Treatment:                                                DATE: 09/24/2022  Therapeutic Exercise: UBE level 1, fwd/back, 3 min each  Resisted Rows GTB x 20  Shoulder ext/Pull downs GTB x 20  Horizontal abduction GTB 2 x 10 Bilateral shoulder ER, GTB 2 x 10  Resisted Diagonals, GTB  x10, x5 each side  Reviewed HEP and importance of compliance with home program  Shoulder Rolls fwd/back x10 each  Cervical UT stretch, 2 x 30 sec each side  Cervical LS stretch, 2 x 30 sec each side  Chin tucks into ball, seated, 3 sec x 20   Modalities:  Moist heat pack applied to neck bilaterally during cervical mobility exercises.     PATIENT EDUCATION:  Education details: provided and reviewed HEP Person educated: Patient Education method: Explanation, Demonstration, and Handouts Education comprehension: verbalized understanding, returned demonstration, and needs further education  HOME EXERCISE PROGRAM: Access Code: [email or web address] URL: [email or web address] Date: 09/17/2022 Prepared by: Mauri Reading  Exercises - Supine Cervical Retraction with Towel  - 2-3 x daily - 7 x weekly - 2 sets - 10 reps - 3 sec hold - Supine Cervical Rotation AROM on Pillow  - 2-3 x daily - 7 x weekly - 2 sets - 10 reps - Seated Scapular Retraction  - 2-3 x daily - 7  x weekly - 2 sets - 10 reps - 3 sec hold - Seated Assisted Cervical Rotation with Towel  - 2-3 x daily - 7 x weekly - 1 sets - 10 reps - 3 sec hold  ASSESSMENT:  CLINICAL IMPRESSION: Tyasia continues to tolerate progression of resistance with periscapular and shoulder strengthening program with minimal exacerbation of  symptoms. She was able to tolerate today's session without manual therapy. We plan to reassess progress towards goals and next visit and discuss possible need for ongoing skilled PT services, pending insurance authorization.    OBJECTIVE IMPAIRMENTS: decreased activity tolerance, decreased ROM, decreased strength, impaired UE functional use, postural dysfunction, and pain.   ACTIVITY LIMITATIONS: carrying, lifting, sleeping, bed mobility, dressing, and hygiene/grooming  PARTICIPATION LIMITATIONS: meal prep, cleaning, laundry, driving, and community activity  PERSONAL FACTORS: Age, Past/current experiences, Time since onset of injury/illness/exacerbation, and 3+ comorbidities: Pmhx of breast cancer, diabetes, hypercholesterolemia, HTN   are also affecting patient's functional outcome.   REHAB POTENTIAL: Fair   CLINICAL DECISION MAKING: Evolving/moderate complexity  EVALUATION COMPLEXITY: Moderate   GOALS: Goals reviewed with patient? Yes  SHORT TERM GOALS: Target date: 09/16/2022   Patient will have decreased headaches to 4 days/week or less in order to improve QOL.  Baseline: constant, daily headaches 09/24/22: decreased to 6 days/week, "some days are definitely worse than others."  Goal status: PROGRESSING  2.  Patient will be independent with home program to address postural endurance, cervical ROM, and UE strength.  Baseline: initiated at eval Goal status: MET    LONG TERM GOALS: Target date: 10/14/2022   Patient will report ability to drive at least 45 minutes without exacerbation of symptoms in order to transport her daughter to school.  Baseline: constant pain with driving  Goal status: INITIAL  2.  Patient will report ability to centralize and/or decrease her symptoms with her prescribed home program in order to promote independent pain management strategies following discharge from PT.  Goal status: INITIAL  3.  Patient will report ability to perform bed mobility  activities in the morning without exacerbation of symptoms.  Baseline: unable to perform without assistance d/t reported pain and stiffness Goal status: INITIAL  4.  Patient will have increased grip strength to at least 50# bilaterally.  Baseline: see objective findings  Goal status: INITIAL  5.  Patient will demonstrate at least 4+/5 MMT bilaterally in order to improved return to ADLs.  Baseline: see objective findings  Goal status: INITIAL  6.  Patient will have decreased NDI score to 60% or less in order to demonstrate improved tolerance of functional activities and improved overall QOL.  Baseline: 80% Goal status: INITIAL    PLAN:  PT FREQUENCY: 2x/week  PT DURATION: 8 weeks  PLANNED INTERVENTIONS: Therapeutic exercises, Therapeutic activity, Neuromuscular re-education, Patient/Family education, Self Care, Joint mobilization, Dry Needling, Electrical stimulation, Spinal mobilization, Cryotherapy, Moist heat, Taping, Traction, Manual therapy, and Re-evaluation  PLAN FOR NEXT SESSION: review and progress home program as needed. Initiate manual therapy, trial cervical distraction. Postural reeducation and scapular retraction. Progress cervical mobility and strengthening program as appropriate.        Mauri Reading, PT, DPT 09/24/2022, 10:53 AM

## 2022-09-26 ENCOUNTER — Ambulatory Visit: Payer: Medicaid Other

## 2022-09-26 ENCOUNTER — Telehealth: Payer: Self-pay

## 2022-09-26 NOTE — Telephone Encounter (Signed)
LVM d/t to missed appt time. Requesting pt to call back to reschedule for later time today and to confirm next scheduled appt. - MJ

## 2022-10-09 ENCOUNTER — Ambulatory Visit: Payer: Medicaid Other | Attending: Family

## 2022-10-09 DIAGNOSIS — G8929 Other chronic pain: Secondary | ICD-10-CM | POA: Diagnosis present

## 2022-10-09 DIAGNOSIS — M5412 Radiculopathy, cervical region: Secondary | ICD-10-CM | POA: Diagnosis present

## 2022-10-09 DIAGNOSIS — M25511 Pain in right shoulder: Secondary | ICD-10-CM | POA: Diagnosis present

## 2022-10-09 DIAGNOSIS — M25512 Pain in left shoulder: Secondary | ICD-10-CM | POA: Insufficient documentation

## 2022-10-09 NOTE — Therapy (Signed)
OUTPATIENT PHYSICAL THERAPY TREATMENT   Patient Name: Jaclyn Cooper MRN: [id number] DOB:[birth year], 52 y.o., female Today's Date: 10/09/2022  END OF SESSION:  PT End of Session - 10/09/22 1042     Visit Number 8    Number of Visits 16    PT Start Time 1050    PT Stop Time 1130    PT Time Calculation (min) 40 min    Activity Tolerance Patient tolerated treatment well;Patient limited by pain    Behavior During Therapy [facility] for tasks assessed/performed                    Past Medical History:  Diagnosis Date   Breast cancer (HCC) 10/21/2012   Depression 07/31/2020   Past Surgical History:  Procedure Laterality Date   MASTECTOMY Left 05/16/2013   Patient Active Problem List   Diagnosis Date Noted   Thrombocytosis, unspecified 06/12/2022    Class: Chronic   Mass of soft tissue of upper arm 06/11/2022    Class: Diagnosis of   Neck pain on left side 06/11/2022    Class: Chronic   Tobacco abuse 07/31/2021    Class: Chronic   Lymphedema of upper extremity following lymphadenectomy 07/31/2015    Class: Chronic   Malignant neoplasm of upper-outer quadrant of left breast in female, estrogen receptor positive (HCC) 10/21/2012    PCP: Jerrye Bushy, FNP  REFERRING PROVIDER: Donalee Citrin, MD  REFERRING DIAG: Radiculopathy, cervical region [phone]   THERAPY DIAG:  Chronic pain of both shoulders  Radiculopathy, cervical region  Rationale for Evaluation and Treatment: Rehabilitation  ONSET DATE: approx. 1 year  SUBJECTIVE:                                                                                                                                                                                                         SUBJECTIVE STATEMENT: Patient reports that her pain levels have increased to 10/10 since she's been running around with her daughter's graduation.    PERTINENT HISTORY:  Pmhx of breast cancer, L mastectomy (10 years ago) right-sided  foot drop, diabetes, hypercholesterolemia, HTN   PAIN:  Are you having pain? Yes: NPRS scale: 6/10 Pain location: neck, thoracic/periscap, bilateral shoulders, B arms Pain description: dull, stabbing, burning, numbness, occasional cramps in thoracic/lumbar spine  Aggravating factors: bed mobility, driving with the seatbelt  Relieving factors: laying down, rest   PRECAUTIONS: None  WEIGHT BEARING RESTRICTIONS: No  FALLS:  Has patient fallen in last 6 months? No  LIVING ENVIRONMENT: Lives with: lives with their family Lives  in: House/apartment Stairs: No Has following equipment at home: Single point cane and Walker - 2 wheeled  OCCUPATION: n/a  PLOF: Independent with basic ADLs, help from home health nursing and family for meal preparation  PATIENT GOALS: To have less pain with driving her daughter to school, getting out of bed, and less headaches.   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 She has got normal neurologic exam will try her on some physical therapy and see how she tolerated that I will see her back in 2 months time.   PATIENT SURVEYS:  NDI 80% current  76% as of 10/09/22  COGNITION: Overall cognitive status: Within functional limits for tasks assessed  SENSATION: Not tested  POSTURE: rounded shoulders and forward head  PALPATION: Tenderness to palpation bilaterally    CERVICAL ROM:   Active ROM A/PROM (deg) eval 10/09/22  Flexion WFL   Extension 30 40  Right lateral flexion WFL   Left lateral flexion 30 40  Right rotation 30 35  Left rotation 60 45   (Blank rows = not tested)  UPPER EXTREMITY MMT:  MMT Right eval Left eval 10/09/22 10/09/22  Shoulder flexion 4- 4 4 4   Shoulder extension       Shoulder abduction 4- 4 4 4   Shoulder adduction      Shoulder extension      Shoulder internal rotation      Shoulder external rotation      Middle trapezius      Lower trapezius      Elbow flexion 4 4- 4 4  Elbow extension 4 3+ 4 4-  Wrist flexion      Wrist extension      Wrist ulnar deviation      Wrist radial deviation      Wrist pronation      Wrist supination      Grip strength 45# avg (3 trials) 35# avg (3 trials) 25# (2 trials) 20# (2 trials)    (Blank rows = not tested)   TODAY'S TREATMENT:  [facility] Adult PT Treatment:                                                DATE: 09/26/2022  Therapeutic Exercise: UBE level 2, fwd/back, 3 min each  Resisted Rows red TB x 20  Shoulder ext/Pull downs red TB x 20  Cervical UT stretch, 2 x 30 sec each side   Manual Therapy: STM and myofascial trigger point release along cervical paraspinals, B UT, LS, scalenes, and suboccipitals  Manual Cervical Traction  Grade 1 first rib mob  Therapeutic Activity: Reassessment of objective and subjective related to overall progressing towards established goals.    [facility] Adult PT Treatment:                                                DATE: 09/24/2022  Therapeutic Exercise: UBE level 1, fwd/back, 3 min each  Resisted Rows GTB x 20  Shoulder ext/Pull downs red TB x 20  Horizontal abduction GTB 2 x 10 Bilateral shoulder ER, GTB 2 x 10  Resisted Diagonals, GTB  x10, x5 each side  Reviewed HEP and importance of compliance with home program  Shoulder Rolls fwd/back x10 each  Cervical UT stretch, 2 x 30 sec each side  Cervical LS stretch, 2 x 30 sec each side  Chin tucks into ball, seated, 3 sec x 20   Modalities:  Moist heat pack applied to neck bilaterally during cervical mobility exercises.     PATIENT EDUCATION:  Education details: provided and reviewed HEP Person educated: Patient Education method: Explanation, Demonstration, and Handouts Education comprehension: verbalized  understanding, returned demonstration, and needs further education  HOME EXERCISE PROGRAM: Access Code: [email or web address] URL: [email or web address] Date: 09/17/2022 Prepared by: Mauri Reading  Exercises - Supine Cervical Retraction with Towel  - 2-3 x daily - 7 x weekly - 2 sets - 10 reps - 3 sec hold - Supine Cervical Rotation AROM on Pillow  - 2-3 x daily - 7 x weekly - 2 sets - 10 reps - Seated Scapular Retraction  - 2-3 x daily - 7 x weekly - 2 sets - 10 reps - 3 sec hold - Seated Assisted Cervical Rotation with Towel  - 2-3 x daily - 7 x weekly - 1 sets - 10 reps - 3 sec hold  ASSESSMENT:  CLINICAL IMPRESSION: Shania is reporting some improvement with tolerating her normal daily activities. However, she is limited today with objective assessment due to increased pain levels r/t to recent pain exacerbation. She is demonstrating some improved UE MMT and improved overall pain severity following manual therapy. Patient will benefit from ongoing skilled PT services, 1-2x/week for 3 additional weeks in order to maximize rehab outcomes and promote independence with prescribed home exercise program.    OBJECTIVE IMPAIRMENTS: decreased activity tolerance, decreased ROM, decreased strength, impaired UE functional use, postural dysfunction, and pain.   ACTIVITY LIMITATIONS: carrying, lifting, sleeping, bed mobility, dressing, and hygiene/grooming  PARTICIPATION LIMITATIONS: meal prep, cleaning, laundry, driving, and community activity  PERSONAL FACTORS: Age, Past/current experiences, Time since onset of injury/illness/exacerbation, and 3+ comorbidities: Pmhx of breast cancer, diabetes, hypercholesterolemia, HTN   are also affecting patient's functional outcome.   REHAB POTENTIAL: Fair   CLINICAL DECISION MAKING: Evolving/moderate complexity  EVALUATION COMPLEXITY: Moderate   GOALS: Goals reviewed with patient? Yes  SHORT TERM GOALS: Target date: 09/16/2022   Patient will have  decreased headaches to 4 days/week or less in order to improve QOL.  Baseline: constant, daily headaches 09/24/22: decreased to 6 days/week, "some days are definitely worse than others."  10/09/22: "my headaches are kind of up and down"  Goal status: PROGRESSING  2.  Patient will be independent with home program to address postural endurance, cervical ROM, and UE strength.  Baseline: initiated at eval Goal status: MET    LONG TERM GOALS: Target date: 11/01/22, as of 10/09/22   Patient will report ability to drive at least 45 minutes without exacerbation of symptoms in order to transport her daughter to school.  Baseline: constant pain with driving  5/40/98: able to drive >11 minutes, but sometimes needs her children to drive d/t pain severity  Goal status: INITIAL  2.  Patient will report ability to centralize and/or decrease her symptoms with her prescribed home program in order to promote independent pain management strategies following discharge from PT.  Goal status: INITIAL  3.  Patient will report ability to perform bed mobility  activities in the morning without exacerbation of symptoms.  Baseline: unable to perform without assistance d/t reported pain and stiffness Goal status: INITIAL  4.  Patient will have increased grip strength to at least 50# bilaterally.  Baseline: see objective findings  Goal status: ONGOING  5.  Patient will demonstrate at least 4+/5 MMT bilaterally in order to improved return to ADLs.  Baseline: see objective findings  Goal status: ONGOING  6.  Patient will have decreased NDI score to 60% or less in order to demonstrate improved tolerance of functional activities and improved overall QOL.  Baseline: 80% disability 10/09/22: 76% disability  Goal status: PROGRESSING    PLAN:  PT FREQUENCY: 1-2x/week as of 10/09/22  PT DURATION: 3 weeks as of 10/09/22  PLANNED INTERVENTIONS: Therapeutic exercises, Therapeutic activity, Neuromuscular re-education,  Patient/Family education, Self Care, Joint mobilization, Dry Needling, Electrical stimulation, Spinal mobilization, Cryotherapy, Moist heat, Taping, Traction, Manual therapy, and Re-evaluation  PLAN FOR NEXT SESSION: review and progress home program as needed. Initiate manual therapy, trial cervical distraction. Postural reeducation and scapular retraction. Progress cervical mobility and strengthening program as appropriate.        Mauri Reading, PT, DPT 10/10/2022, 3:44 PM

## 2022-10-14 ENCOUNTER — Ambulatory Visit: Payer: Medicaid Other

## 2022-10-16 ENCOUNTER — Ambulatory Visit: Payer: Medicaid Other

## 2022-11-05 ENCOUNTER — Ambulatory Visit: Payer: Medicaid Other | Attending: Family

## 2022-11-05 DIAGNOSIS — G8929 Other chronic pain: Secondary | ICD-10-CM | POA: Diagnosis present

## 2022-11-05 DIAGNOSIS — M25512 Pain in left shoulder: Secondary | ICD-10-CM | POA: Diagnosis present

## 2022-11-05 DIAGNOSIS — M5412 Radiculopathy, cervical region: Secondary | ICD-10-CM | POA: Insufficient documentation

## 2022-11-05 DIAGNOSIS — M25511 Pain in right shoulder: Secondary | ICD-10-CM | POA: Diagnosis present

## 2022-11-05 NOTE — Therapy (Addendum)
 OUTPATIENT PHYSICAL THERAPY TREATMENT   Discharge Summary Visits from Start of Care: 9  Current functional level related to goals / functional outcomes: See assessment   Remaining deficits: Current status unknown    Education / Equipment: HEP   Patient agrees to discharge. Patient goals were not met. Patient is being discharged due to not returning since the last visit.  Joneen Fresh PT, DPT, LAT, ATC  02/24/24  8:18 AM      Patient Name: Jaclyn Cooper MRN: [id number] DOB:Aug 30, 1970, 52 y.o., female Today's Date: 10/09/2022  END OF SESSION:  PT End of Session - 11/05/22 1136     Visit Number 9    Number of Visits 16    Date for PT Re-Evaluation 11/26/22    Authorization Type [employer]    Authorization Time Period 10/13/22-11/11/22    Authorization - Visit Number 1    Authorization - Number of Visits 4    PT Start Time 1135    PT Stop Time 1213    PT Time Calculation (min) 38 min    Activity Tolerance Patient tolerated treatment well;Patient limited by pain    Behavior During Therapy [facility] for tasks assessed/performed                     Past Medical History:  Diagnosis Date   Breast cancer (HCC) 10/21/2012   Depression 07/31/2020   Past Surgical History:  Procedure Laterality Date   MASTECTOMY Left 05/16/2013   Patient Active Problem List   Diagnosis Date Noted   Thrombocytosis, unspecified 06/12/2022    Class: Chronic   Mass of soft tissue of upper arm 06/11/2022    Class: Diagnosis of   Neck pain on left side 06/11/2022    Class: Chronic   Tobacco abuse 07/31/2021    Class: Chronic   Lymphedema of upper extremity following lymphadenectomy 07/31/2015    Class: Chronic   Malignant neoplasm of upper-outer quadrant of left breast in female, 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 1 year  SUBJECTIVE:                                                                                                                                                                                                         SUBJECTIVE STATEMENT: Patient  was unable to attend PT over the past 3 weeks. Today is her first day back since most recent progress note. Pt has poor HEP compliance and states I know I need to do better. Now, that things have calmed down, I can work on it now.    PERTINENT HISTORY:  Pmhx of breast cancer, L mastectomy (10 years ago) right-sided foot drop, diabetes, hypercholesterolemia, HTN   PAIN:  Are you having pain? Yes: NPRS scale: 6/10 Pain location: neck, thoracic/periscap, bilateral shoulders, B arms Pain description: dull, stabbing, burning, numbness, occasional cramps in thoracic/lumbar spine  Aggravating factors: bed mobility, driving with the seatbelt  Relieving factors: laying down, rest   PRECAUTIONS: None  WEIGHT BEARING RESTRICTIONS: No  FALLS:  Has patient fallen in last 6 months? No  LIVING ENVIRONMENT: Lives with: lives with their family Lives in: House/apartment Stairs: No Has following equipment at home: Single point cane and Environmental Consultant - 2 wheeled  OCCUPATION: n/a  PLOF: Independent with basic ADLs, help from home health nursing and family for meal preparation  PATIENT GOALS: To have less pain with driving her daughter to school, getting out of bed, and less headaches.   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 She has got normal neurologic  exam will try her on some physical therapy and see how she tolerated that I will see her back in 2 months time.   PATIENT SURVEYS:  NDI 80% current  76% as of 10/09/22  COGNITION: Overall cognitive status: Within functional limits for tasks assessed  SENSATION: Not tested  POSTURE: rounded shoulders and forward head  PALPATION: Tenderness to palpation bilaterally    CERVICAL ROM:   Active ROM A/PROM (deg) eval 10/09/22  Flexion WFL   Extension 30 40  Right lateral flexion WFL   Left lateral flexion 30 40  Right rotation 30 35  Left rotation 60 45   (Blank rows = not tested)  UPPER EXTREMITY MMT:  MMT Right eval Left eval 10/09/22 10/09/22  Shoulder flexion 4- 4 4 4   Shoulder extension      Shoulder abduction 4- 4 4 4   Shoulder adduction      Shoulder extension      Shoulder internal rotation      Shoulder external rotation      Middle trapezius      Lower trapezius      Elbow flexion 4 4- 4 4  Elbow extension 4 3+ 4 4-  Wrist flexion      Wrist extension      Wrist ulnar deviation      Wrist radial deviation      Wrist pronation      Wrist supination      Grip strength 45# avg (3 trials) 35# avg (3 trials) 25# (2 trials) 20# (2 trials)    (Blank rows = not tested)   TODAY'S TREATMENT:  [facility] Adult PT Treatment:                                                DATE: 11/05/2022  Therapeutic Exercise: UBE level 2, fwd  x 3 min each - unable to tolerate backward today Resisted Rows red TB x 20  Shoulder ext/Pull downs red TB x 20  Cervical UT stretch, 2 x 30 sec each side  Horizontal abduction red TB 2 x 10 Bilateral shoulder ER, red TB 2 x 10  Resisted Diagonals, red TB  x10 each side    Therapeutic Activity: Patient education regarding importance of HEP compliance and strategies for incorporating exercise into daily routine in order to maximize rehab goals.  Bed mobility training for proper muscle activation patterns   [facility] Adult PT Treatment:                                                 DATE: 09/26/2022  Therapeutic Exercise: UBE level 2, fwd/back, 3 min each  Resisted Rows red TB x 20  Shoulder ext/Pull downs red TB x 20  Cervical UT stretch, 2 x 30 sec each side   Manual Therapy: STM and myofascial trigger point release along cervical paraspinals, B UT, LS, scalenes, and suboccipitals  Manual Cervical Traction  Grade 1 first rib mob  Therapeutic Activity: Reassessment of objective and subjective related to overall progressing towards established goals.    [facility] Adult PT Treatment:                                                DATE: 09/24/2022  Therapeutic Exercise: UBE level 1, fwd/back, 3 min each  Resisted Rows GTB x 20  Shoulder ext/Pull downs red TB x 20  Horizontal abduction GTB 2 x 10 Bilateral shoulder ER, GTB 2 x 10  Resisted Diagonals, GTB  x10, x5 each side  Reviewed HEP and importance of compliance with home program  Shoulder Rolls fwd/back x10 each  Cervical UT stretch, 2 x 30 sec each side  Cervical LS stretch, 2 x 30 sec each side  Chin tucks into ball, seated, 3 sec x 20   Modalities:  Moist heat pack applied to neck bilaterally during cervical mobility exercises.     PATIENT EDUCATION:  Education details: provided and reviewed HEP Person educated: Patient Education method: Explanation, Demonstration, and Handouts Education comprehension: verbalized understanding, returned demonstration, and needs further education  HOME EXERCISE PROGRAM: Access Code: [email or web address] URL: [email or web address] Date: 09/17/2022 Prepared by: Marko Molt  Exercises - Supine Cervical Retraction with Towel  - 2-3 x daily - 7 x weekly - 2 sets - 10 reps - 3 sec hold - Supine Cervical Rotation AROM on Pillow  - 2-3 x daily - 7 x weekly - 2 sets - 10 reps - Seated Scapular Retraction  - 2-3 x daily - 7 x weekly - 2 sets - 10 reps - 3 sec hold - Seated Assisted Cervical Rotation with Towel  - 2-3 x daily - 7 x  weekly - 1 sets - 10 reps - 3 sec hold  ASSESSMENT:  CLINICAL IMPRESSION: Amanii has minimal changes since most recent progress note due to inability to attend PT and poor HEP compliance. She has made some progress towards established goals, including decreased headache frequency. Spent additional pt education time today reinforcing importance of compliance with prescribed home program, strategies for incorporating exercises into her daily routine, and strategies for  decreased UE strain during supine to sit transfer. We will progress home program and treatment sessions as able towards established goals.     OBJECTIVE IMPAIRMENTS: decreased activity tolerance, decreased ROM, decreased strength, impaired UE functional use, postural dysfunction, and pain.   ACTIVITY LIMITATIONS: carrying, lifting, sleeping, bed mobility, dressing, and hygiene/grooming  PARTICIPATION LIMITATIONS: meal prep, cleaning, laundry, driving, and community activity  PERSONAL FACTORS: Age, Past/current experiences, Time since onset of injury/illness/exacerbation, and 3+ comorbidities: Pmhx of breast cancer, diabetes, hypercholesterolemia, HTN  are also affecting patient's functional outcome.   REHAB POTENTIAL: Fair   CLINICAL DECISION MAKING: Evolving/moderate complexity  EVALUATION COMPLEXITY: Moderate   GOALS: Goals reviewed with patient? Yes  SHORT TERM GOALS: Target date: 09/16/2022   Patient will have decreased headaches to 4 days/week or less in order to improve QOL.  Baseline: constant, daily headaches 09/24/22: decreased to 6 days/week, some days are definitely worse than others.  10/09/22: my headaches are kind of up and down  11/05/22: 3 headaches last week Goal status: MET  2.  Patient will be independent with home program to address postural endurance, cervical ROM, and UE strength.  Baseline: initiated at eval Goal status: MET    LONG TERM GOALS: Target date: 11/26/2022, as of  11/05/22   Patient will report ability to drive at least 45 minutes without exacerbation of symptoms in order to transport her daughter to school.  Baseline: constant pain with driving  3/86/75: able to drive >79 minutes, but sometimes needs her children to drive d/t pain severity  Goal status: ONGOING  2.  Patient will report ability to centralize and/or decrease her symptoms with her prescribed home program in order to promote independent pain management strategies following discharge from PT.  Goal status: ONGOING  3.  Patient will report ability to perform bed mobility activities in the morning without exacerbation of symptoms.  Baseline: unable to perform without assistance d/t reported pain and stiffness Goal status: PROGRESSING  4.  Patient will have increased grip strength to at least 50# bilaterally.  Baseline: see objective findings  Goal status: ONGOING  5.  Patient will demonstrate at least 4+/5 MMT bilaterally in order to improved return to ADLs.  Baseline: see objective findings  Goal status: ONGOING  6.  Patient will have decreased NDI score to 60% or less in order to demonstrate improved tolerance of functional activities and improved overall QOL.  Baseline: 80% disability 10/09/22: 76% disability  Goal status: PROGRESSING    PLAN:  PT FREQUENCY: 1-2x/week as of 11/05/22  PT DURATION: 3 weeks as of 11/05/22  PLANNED INTERVENTIONS: Therapeutic exercises, Therapeutic activity, Neuromuscular re-education, Patient/Family education, Self Care, Joint mobilization, Dry Needling, Electrical stimulation, Spinal mobilization, Cryotherapy, Moist heat, Taping, Traction, Manual therapy, and Re-evaluation  PLAN FOR NEXT SESSION: review and progress home program as needed. Initiate manual therapy, trial cervical distraction. Postural reeducation and scapular retraction. Progress cervical mobility and strengthening program as appropriate.        Marko Molt, PT,  DPT 11/05/2022, 2:59 PM

## 2022-11-12 ENCOUNTER — Ambulatory Visit: Payer: Medicaid Other | Admitting: Physical Therapy

## 2022-11-12 ENCOUNTER — Telehealth: Payer: Self-pay | Admitting: Physical Therapy

## 2022-11-12 NOTE — Telephone Encounter (Signed)
Called and informed patient of missed visit and attendance policy via VM.  Will cancel remaining appts based on attendance policy (3 NS and 1 late cancel).  Pt will require new referral to be seen.

## 2022-11-18 ENCOUNTER — Ambulatory Visit: Payer: Medicaid Other | Admitting: Physical Therapy

## 2022-11-19 ENCOUNTER — Ambulatory Visit: Payer: Medicaid Other | Admitting: Physical Therapy

## 2022-12-23 ENCOUNTER — Inpatient Hospital Stay: Payer: Medicaid Other

## 2022-12-23 ENCOUNTER — Telehealth: Payer: Self-pay | Admitting: Oncology

## 2022-12-23 ENCOUNTER — Inpatient Hospital Stay: Payer: Medicaid Other | Admitting: Oncology

## 2022-12-23 NOTE — Telephone Encounter (Signed)
12/23/2022  Pt missed her Appt's today - Left Message for patient to return call to reschedule

## 2022-12-26 NOTE — Progress Notes (Incomplete)
[facility]  [address] [place],  [place]  [address] [phone]  Clinic Day: 06/23/22  Referring physician: Jerrye Bushy, FNP   CHIEF COMPLAINT:  CC: Stage IIIA hormone receptor positive breast cancer with left breast pain  Current Treatment:  Observation  HISTORY OF PRESENT ILLNESS:  Jaclyn Cooper is a 52 y.o. female with a history of stage IIIA (T3 N1NOS M0) hormone receptor positive left breast cancer diagnosed in June 2014.  She presented with an 8.6 cm lesion of the left breast, as well as a 4.5 cm left axillary node.  Biopsies revealed grade 3, invasive ductal carcinoma of the breast, with metastasis to the lymph node.  Estrogen and progesterone receptors were positive and HER 2 Neu negative.  Ki 67 was 17%.  She received neoadjuvant chemotherapy consisting of 6 cycles of docetaxel, doxorubicin, and cyclophosphamide, which was completed in November 2014.  She underwent left mastectomy in January 2015.  Pathology revealed a residual 2 cm invasive ductal carcinoma with a focally positive posterior margin.  Metastasis was seen in 1/6 lymph nodes.  She received adjuvant radiation to the left chest wall and axilla completed in March 2015.  She was placed on tamoxifen in April 2015.  She developed lymphedema of her left arm, for which she received occupational therapy with improvement.  She also had cognitive changes associated with chemotherapy, for which she underwent treatment with the speech therapist.  In May 2015, there was a concern about a possible nodule in the left axilla, but ultrasound was negative.  In March 2017, she had a small left inguinal node, and ultrasound revealed a prominent, but normal appearing left inguinal node.    She has had chronic pain in the left mastectomy site, which had been managed with gabapentin and duloxetine. She was lost to follow-up from November 2018 until September 2019 and had been out of her tamoxifen.   Duloxetine had been discontinued and she had tizanidine in addition to the gabapentin.  She requested a refill of all her medications, but we had asked that her primary care provider manage her medications. We did refill her gabapentin 300 mg twice daily at that time but asked her to follow-up with her primary care provider as well.  We recommend that she receive a total of 10 years of adjuvant hormonal therapy, but she has had multiple long interruptions due to non-compliance.  At her visit in June 2021, she reported persistent pain of the right breast and left chest wall.  She has fibrocystic changes of the right breast, so was advised to cut back on her caffeine intake and take vitamin E 400 IU once daily. She continued to chronic pain in the left chest wall and axilla without evidence of recurrence. She is a smoker but is too young to qualify for low dose lung cancer screening.    INTERVAL HISTORY:  Jaclyn Cooper is here for follow up of her history of stage IIIA left breast cancer. Patient states that she feels *** and ***.     She denies signs of infection such as sore throat, sinus drainage, cough, or urinary symptoms.  She denies fevers or recurrent chills. She denies pain. She denies nausea, vomiting, chest pain, dyspnea or cough. Her appetite is *** and her weight {Weight change:10426}.  Patient states that she is ok and complains of left arm pain rating 8/10. Her MRI of the neck and brain are rescheduled for 03/062024. Her cervical spine x-ray showed  severe degenerative disk disease especially in the C5, C6, and C7 that has pressure on the nerves of both sides. It also revealed bone spurs in her neck. I advised her she will need to see an orthopedist or neurologist. I will make her an referral to one in [place]. The x-ray of her humerus was negative. Her ultrasound of that area was clear which leads me to believe that she pulled a muscle in her left arm. I advised her to take care of it and alternate  with hot and cold compresses. I will see her back in 6 months with CBC and CMP.  She denies signs of infection such as sore throat, sinus drainage, cough, or urinary symptoms.  She denies fevers or recurrent chills. She denies pain. She denies nausea, vomiting, chest pain, dyspnea or cough. Her appetite is up and down and her weight has been stable.  REVIEW OF SYSTEMS:  Review of Systems  Constitutional:  Negative for appetite change, chills, diaphoresis, fatigue, fever and unexpected weight change.  HENT:  Negative.  Negative for hearing loss, lump/mass, mouth sores, nosebleeds, sore throat, tinnitus, trouble swallowing and voice change.        Dry mouth  Eyes: Negative.  Negative for eye problems (blurred vision) and icterus.  Respiratory: Negative.  Negative for chest tightness, cough, hemoptysis, shortness of breath and wheezing.   Cardiovascular: Negative.  Negative for chest pain, leg swelling and palpitations.  Gastrointestinal: Negative.  Negative for abdominal distention, abdominal pain, blood in stool, constipation, diarrhea, nausea, rectal pain and vomiting.  Endocrine: Negative.  Negative for hot flashes.  Genitourinary: Negative.  Negative for bladder incontinence, difficulty urinating, dyspareunia, dysuria, frequency, hematuria, menstrual problem, nocturia, pelvic pain, vaginal bleeding and vaginal discharge.   Musculoskeletal:  Positive for myalgias (left axilla) and neck stiffness (numbness). Negative for arthralgias, back pain, flank pain, gait problem and neck pain.       Burning sensation in her left chest wall and axilla, radiating down the left arm.  Skin: Negative.  Negative for itching, rash and wound.  Neurological:  Positive for light-headedness and numbness (on her leftside). Negative for dizziness, extremity weakness, gait problem, headaches, seizures and speech difficulty.  Hematological: Negative.  Negative for adenopathy. Does not bruise/bleed easily.   Psychiatric/Behavioral:  Positive for confusion. Negative for decreased concentration, depression, sleep disturbance and suicidal ideas. The patient is not nervous/anxious.        Decreased memory.   All other systems reviewed and are negative.   VITALS:  There were no vitals taken for this visit.  Wt Readings from Last 3 Encounters:  06/23/22 186 lb 8 oz (84.6 kg)  06/11/22 185 lb 1.6 oz (84 kg)  12/09/21 187 lb 12.8 oz (85.2 kg)    There is no height or weight on file to calculate BMI.  Performance status (ECOG): 1 - Symptomatic but completely ambulatory  PHYSICAL EXAM:  Physical Exam Vitals and nursing note reviewed.  Constitutional:      General: She is not in acute distress.    Appearance: Normal appearance. She is normal weight. She is not ill-appearing, toxic-appearing or diaphoretic.  HENT:     Head: Normocephalic and atraumatic.     Right Ear: Tympanic membrane, ear canal and external ear normal. There is no impacted cerumen.     Left Ear: Tympanic membrane, ear canal and external ear normal. There is no impacted cerumen.     Nose: Nose normal. No congestion or rhinorrhea.     Mouth/Throat:  Mouth: Mucous membranes are moist.     Pharynx: Oropharynx is clear. No oropharyngeal exudate or posterior oropharyngeal erythema.  Eyes:     General: No scleral icterus.       Right eye: No discharge.        Left eye: No discharge.     Extraocular Movements: Extraocular movements intact.     Conjunctiva/sclera: Conjunctivae normal.     Pupils: Pupils are equal, round, and reactive to light.  Neck:     Vascular: No carotid bruit.  Cardiovascular:     Rate and Rhythm: Normal rate and regular rhythm.     Pulses: Normal pulses.     Heart sounds: Normal heart sounds. No murmur heard.    No friction rub. No gallop.  Pulmonary:     Effort: Pulmonary effort is normal. No respiratory distress.     Breath sounds: Normal breath sounds. No stridor. No wheezing, rhonchi or rales.   Chest:     Chest wall: No tenderness.  Breasts:    Right: Normal. No swelling, bleeding, inverted nipple, mass, nipple discharge, skin change or tenderness.     Left: Absent.     Comments: Left mastectomy is negative.   Scar of right posterior shoulder well healed. Scar on right upper chest where her port was. Right breast with masses. Dark nevus in the anterior shoulder which appears irregular in pigmentation.  Abdominal:     General: Bowel sounds are normal. There is no distension.     Palpations: Abdomen is soft. There is no hepatomegaly, splenomegaly or mass.     Tenderness: There is no abdominal tenderness. There is no right CVA tenderness, left CVA tenderness, guarding or rebound.     Hernia: No hernia is present.  Musculoskeletal:        General: No swelling, deformity or signs of injury. Normal range of motion.     Right upper arm: No tenderness.     Left upper arm: Tenderness present.     Cervical back: Normal range of motion and neck supple. No rigidity or tenderness.     Right lower leg: No edema.     Left lower leg: No edema.     Comments: Firm soft tissue about mid way down her posterior left arm which was extremely tender to palpation.    Lymphadenopathy:     Cervical: No cervical adenopathy.     Upper Body:     Right upper body: No supraclavicular or axillary adenopathy.     Left upper body: No supraclavicular or axillary adenopathy.     Lower Body: No right inguinal adenopathy. No left inguinal adenopathy.  Skin:    General: Skin is warm and dry.     Coloration: Skin is not jaundiced or pale.     Findings: No bruising, erythema, lesion or rash.  Neurological:     General: No focal deficit present.     Mental Status: She is alert and oriented to person, place, and time. Mental status is at baseline.     Cranial Nerves: No cranial nerve deficit.     Sensory: No sensory deficit.     Motor: No weakness.     Coordination: Coordination normal.     Gait: Gait  normal.     Deep Tendon Reflexes: Reflexes normal.  Psychiatric:        Attention and Perception: Attention and perception normal.        Mood and Affect: Affect normal. Mood is anxious. Mood is not  depressed. Affect is not tearful.        Speech: Speech normal.        Behavior: Behavior normal.        Thought Content: Thought content normal.        Cognition and Memory: Cognition and memory normal.        Judgment: Judgment normal.    LABS:      Latest Ref Rng & Units 06/11/2022    9:05 AM 12/09/2021   12:00 AM 07/31/2021   12:00 AM  CBC  WBC 4.0 - 10.5 K/uL 7.0  8.7     7.3      Hemoglobin 12.0 - 15.0 g/dL 62.1  30.8     65.7      Hematocrit 36.0 - 46.0 % 41.5  43     43      Platelets 150 - 400 K/uL 445  433     427         This result is from an external source.      Latest Ref Rng & Units 06/11/2022    9:05 AM 12/09/2021   12:00 AM 07/31/2021   12:00 AM  CMP  Glucose 70 - 99 mg/dL 846     BUN 6 - 20 mg/dL 8  10     9       Creatinine 0.44 - 1.00 mg/dL 9.62  0.7     0.6      Sodium 135 - 145 mmol/L 138  138     138      Potassium 3.5 - 5.1 mmol/L 3.8  3.5     4.3      Chloride 98 - 111 mmol/L 107  109     111      CO2 22 - 32 mmol/L 24  24     21       Calcium 8.9 - 10.3 mg/dL 8.6  9.0     8.5      Total Protein 6.5 - 8.1 g/dL 7.3     Total Bilirubin 0.3 - 1.2 mg/dL 0.5     Alkaline Phos 38 - 126 U/L 83  81     93      AST 15 - 41 U/L 19  24     26       ALT 0 - 44 U/L 18  22     25          This result is from an external source.   STUDIES:     Exam: 06/18/2022 Digital Screening Unilateral Right Mammogram with CAD and Tomosynthesis Impression: No mammographic evidence of malignancy.   Exam: 06/18/2022 Ultrasound Left Upper Extremity Limited Impression: No sonographic abnormality in the area of the patient's concern. Recommend clinical sureilance and re-imaging with MRI as indicated.     HISTORY:   Past Medical History:  Diagnosis Date   Breast cancer  (HCC) 10/21/2012   Depression 07/31/2020    Past Surgical History:  Procedure Laterality Date   MASTECTOMY Left 05/16/2013    No family history on file.  Social History:  reports that she has been smoking cigarettes. She has a 4 pack-year smoking history. She has never used smokeless tobacco. She reports current drug use. Drug: Marijuana. No history on file for alcohol use.The patient is alone today.  Allergies:  Allergies  Allergen Reactions   Lovastatin Other (See Comments)    Other reaction(s): Other (See Comments)  Elevated hepatic enzymes   Niacin Itching  Current Medications: Current Outpatient Medications  Medication Sig Dispense Refill   albuterol (VENTOLIN HFA) 108 (90 Base) MCG/ACT inhaler Inhale into the lungs.     amitriptyline (ELAVIL) 50 MG tablet Take by mouth.     atorvastatin (LIPITOR) 40 MG tablet SMARTSIG:1 Tablet(s) By Mouth Every Evening     Calcium Carbonate-Vitamin D 600-5 MG-MCG TABS Take by mouth.     gabapentin (NEURONTIN) 300 MG capsule Take 1 capsule (300 mg total) by mouth 3 (three) times daily. 90 capsule 5   lisinopril (ZESTRIL) 2.5 MG tablet Take 2.5 mg by mouth daily.     metFORMIN (GLUCOPHAGE-XR) 500 MG 24 hr tablet Take by mouth.     SYMBICORT 160-4.5 MCG/ACT inhaler SMARTSIG:2 Puff(s) By Mouth Twice Daily     tamoxifen (NOLVADEX) 20 MG tablet Take 1 tablet (20 mg total) by mouth daily. 90 tablet 3   Tiotropium Bromide-Olodaterol (STIOLTO RESPIMAT) 2.5-2.5 MCG/ACT AERS Inhale into the lungs.     No current facility-administered medications for this visit.     ASSESSMENT & PLAN:   Assessment/Plan:  1. Stage III A hormone receptor positive breast cancer treated with left mastectomy adjuvant chemotherapy and hormonal therapy with tamoxifen. She remains without evidence of recurrence. We have recommended a total of 10 years of tamoxifen.  Unfortunately, she has been on and off the tamoxifen for years. We may need to consider stopping it  with the risk of thromboembolic events and her current symptoms.  2. Tobacco abuse, once again I advised her of the importance of complete abstinence from tobacco.    3.  Black nevus of the left anterior upper chest which appears to be changing and needs to be evaluated. We have referred her to the dermatologist.   4.  Persistent neuropathy.   5. Impaired memory with difficulty with word finding. She also has visual blurring and so I will get an MRI of the brain and consider referral to a neurologist.  6. Pain of the neck radiating down the left arm with possible weakness of the left arm. Her cervical spine x-ray showed severe degenerative disk disease especially in the C5, C6, and C7 that has pressure on the nerves of both sides. It also revealed osteophytes in her neck. I think this is the explanation for her neck and left arm pain and will refer her to a spine surgeon. We will still proceed with the MRI of the cervical spine.   7. Soft tissue of posterior aspect of the left upper arm. This is extremely tender to deep palpation and caused her paresthesias. X-ray and ultrasound are negative so I feel this is a torn or bruised muscle and recommended symptomatic treatment.   Plan Her cervical spine x-ray showed severe degenerative disk disease especially in the C5, C6, and C7 that has pressure on the nerves of both sides. It also revealed bone spurs in her neck. I will make her an referral to a orthopedist or neurologist [facility]. The x-ray of her humerus was negative. Her ultrasound of that area was clear which leads me to believe that she pulled a muscle in her left arm. I advised her to take care of it and alternate with hot and cold compresses. I will see her back in 6 months with CBC and CMP.  The patient understands the plans discussed today and is in agreement with them.  She knows to contact our office if she develops concerns prior to her next appointment.  I provided 30 minutes of  face-to-face time during this this encounter and > 50% was spent counseling as documented under my assessment and plan.    I,Jasmine M Lassiter,acting as a scribe for Dellia Beckwith, MD.,have documented all relevant documentation on the behalf of Dellia Beckwith, MD,as directed by  Dellia Beckwith, MD while in the presence of Dellia Beckwith, MD.

## 2022-12-31 ENCOUNTER — Inpatient Hospital Stay: Payer: Medicaid Other | Attending: Oncology

## 2022-12-31 ENCOUNTER — Inpatient Hospital Stay: Payer: Medicaid Other | Admitting: Oncology

## 2023-05-28 ENCOUNTER — Inpatient Hospital Stay: Payer: Medicaid Other | Attending: Oncology | Admitting: Oncology

## 2023-05-28 ENCOUNTER — Other Ambulatory Visit: Payer: Self-pay | Admitting: Oncology

## 2023-05-28 ENCOUNTER — Inpatient Hospital Stay: Payer: Medicaid Other

## 2023-05-28 VITALS — BP 131/99 | HR 80 | Temp 98.0°F | Resp 18 | Ht 61.0 in | Wt 189.6 lb

## 2023-05-28 DIAGNOSIS — Z9012 Acquired absence of left breast and nipple: Secondary | ICD-10-CM | POA: Diagnosis not present

## 2023-05-28 DIAGNOSIS — C50412 Malignant neoplasm of upper-outer quadrant of left female breast: Secondary | ICD-10-CM

## 2023-05-28 DIAGNOSIS — Z170 Estrogen receptor positive status [ER+]: Secondary | ICD-10-CM | POA: Diagnosis not present

## 2023-05-28 DIAGNOSIS — Z79810 Long term (current) use of selective estrogen receptor modulators (SERMs): Secondary | ICD-10-CM | POA: Diagnosis not present

## 2023-05-28 DIAGNOSIS — Z9221 Personal history of antineoplastic chemotherapy: Secondary | ICD-10-CM | POA: Insufficient documentation

## 2023-05-28 DIAGNOSIS — Z79899 Other long term (current) drug therapy: Secondary | ICD-10-CM | POA: Insufficient documentation

## 2023-05-28 DIAGNOSIS — G629 Polyneuropathy, unspecified: Secondary | ICD-10-CM | POA: Insufficient documentation

## 2023-05-28 DIAGNOSIS — F17210 Nicotine dependence, cigarettes, uncomplicated: Secondary | ICD-10-CM | POA: Insufficient documentation

## 2023-05-28 DIAGNOSIS — C50912 Malignant neoplasm of unspecified site of left female breast: Secondary | ICD-10-CM | POA: Insufficient documentation

## 2023-05-28 LAB — CMP (CANCER CENTER ONLY)
ALT: 15 U/L (ref 0–44)
AST: 15 U/L (ref 15–41)
Albumin: 3.9 g/dL (ref 3.5–5.0)
Alkaline Phosphatase: 104 U/L (ref 38–126)
Anion gap: 13 (ref 5–15)
BUN: 6 mg/dL (ref 6–20)
CO2: 24 mmol/L (ref 22–32)
Calcium: 8.9 mg/dL (ref 8.9–10.3)
Chloride: 104 mmol/L (ref 98–111)
Creatinine: 0.79 mg/dL (ref 0.44–1.00)
GFR, Estimated: >60 mL/min (ref >60)
Glucose, Bld: 174 mg/dL — ABNORMAL HIGH (ref 70–99)
Potassium: 3.5 mmol/L (ref 3.5–5.1)
Sodium: 141 mmol/L (ref 135–145)
Total Bilirubin: 0.2 mg/dL (ref 0.0–1.2)
Total Protein: 7 g/dL (ref 6.5–8.1)

## 2023-05-28 LAB — CBC WITH DIFFERENTIAL (CANCER CENTER ONLY)
Abs Immature Granulocytes: 0.02 10*3/uL (ref 0.00–0.07)
Basophils Absolute: 0.1 10*3/uL (ref 0.0–0.1)
Basophils Relative: 1 %
Eosinophils Absolute: 0.2 10*3/uL (ref 0.0–0.5)
Eosinophils Relative: 2 %
HCT: 40 % (ref 36.0–46.0)
Hemoglobin: 13.8 g/dL (ref 12.0–15.0)
Immature Granulocytes: 0 %
Lymphocytes Relative: 36 %
Lymphs Abs: 2.8 10*3/uL (ref 0.7–4.0)
MCH: 31.4 pg (ref 26.0–34.0)
MCHC: 34.5 g/dL (ref 30.0–36.0)
MCV: 90.9 fL (ref 80.0–100.0)
Monocytes Absolute: 0.5 10*3/uL (ref 0.1–1.0)
Monocytes Relative: 6 %
Neutro Abs: 4.3 10*3/uL (ref 1.7–7.7)
Neutrophils Relative %: 55 %
Platelet Count: 458 10*3/uL — ABNORMAL HIGH (ref 150–400)
RBC: 4.4 MIL/uL (ref 3.87–5.11)
RDW: 13.3 % (ref 11.5–15.5)
WBC Count: 7.8 10*3/uL (ref 4.0–10.5)
nRBC: 0 % (ref 0.0–0.2)
nRBC: 0 /100{WBCs}

## 2023-05-28 NOTE — Progress Notes (Shared)
[facility]  [address] [place],  [place]  [address] [phone]  Clinic Day: 05/28/23   Referring physician: Jerrye Bushy, FNP  CHIEF COMPLAINT:  CC: Stage IIIB hormone receptor positive breast cancer with left breast pain  Current Treatment:  Observation  HISTORY OF PRESENT ILLNESS:  Jaclyn Cooper is a 53 y.o. female with a history of stage IIIA (T3 N1NOS M0) hormone receptor positive left breast cancer diagnosed in June 2014.  She presented with an 8.6 cm lesion of the left breast, as well as a 4.5 cm left axillary node.  Biopsies revealed grade 3, invasive ductal carcinoma of the breast, with metastasis to the lymph node.  Estrogen and progesterone receptors were positive and HER 2 Neu negative.  Ki 67 was 17%.  She received neoadjuvant chemotherapy consisting of 6 cycles of docetaxel, doxorubicin, and cyclophosphamide, which was completed in November 2014.  She underwent left mastectomy in January 2015.  Pathology revealed a residual 2 cm invasive ductal carcinoma with a focally positive posterior margin.  Metastasis was seen in 1/6 lymph nodes.  She received adjuvant radiation to the left chest wall and axilla completed in March 2015.  She was placed on tamoxifen in April 2015.  She developed lymphedema of her left arm, for which she received occupational therapy with improvement.  She also had cognitive changes associated with chemotherapy, for which she underwent treatment with the speech therapist.  In May 2015, there was a concern about a possible nodule in the left axilla, but ultrasound was negative.  In March 2017, she had a small left inguinal node, and ultrasound revealed a prominent, but normal appearing left inguinal node.    She has had chronic pain in the left mastectomy site, which had been managed with gabapentin and duloxetine. She was lost to follow-up from November 2018 until September 2019 and had been out of her tamoxifen.   Duloxetine had been discontinued and she had tizanidine in addition to the gabapentin.  She requested a refill of all her medications, but we had asked that her primary care provider manage her medications. We did refill her gabapentin 300 mg twice daily at that time but asked her to follow-up with her primary care provider as well.  We recommend that she receive a total of 10 years of adjuvant hormonal therapy, but she has had multiple long interruptions due to non-compliance.  At her visit in June 2021, she reported persistent pain of the right breast and left chest wall.  She has fibrocystic changes of the right breast, so was advised to cut back on her caffeine intake and take vitamin E 400 IU once daily. She continued to chronic pain in the left chest wall and axilla without evidence of recurrence. She is a smoker but is too young to qualify for low dose lung cancer screening.    INTERVAL HISTORY:  Jaclyn Cooper is here for follow up of her history of stage IIIA left breast cancer. Patient states that she feels ok but informed me that she went to the PCP this morning and was diagnosed with a stomach virus. She complains of abdominal pressure, nausea, and diarrhea. She continues Tamoxifen without difficulty. She will be due for her annual screening bilateral mammogram in February. She has a WBC of 7.8, hemoglobin of 13.8, and platelet count of 458,000. Her CMP is normal other than a barely normal potassium of 3.5. I recommended bananas and fluids to help with her potassium levels  and current diarrhea. I will see her back in 1 year with CBC, CMP, right mammogram.   She denies signs of infection such as sore throat, sinus drainage, cough, or urinary symptoms.  She denies fevers or recurrent chills. She denies pain. She denies vomiting, chest pain, dyspnea or cough. Her appetite is good and her weight has increased 3 pounds over last 11 months .  REVIEW OF SYSTEMS:  Review of Systems  Constitutional:  Negative for  appetite change, chills, diaphoresis, fatigue, fever and unexpected weight change.  HENT:  Negative.  Negative for hearing loss, lump/mass, mouth sores, nosebleeds, sore throat, tinnitus, trouble swallowing and voice change.        Dry mouth  Eyes: Negative.  Negative for eye problems and icterus.  Respiratory: Negative.  Negative for chest tightness, cough, hemoptysis, shortness of breath and wheezing.   Cardiovascular: Negative.  Negative for chest pain, leg swelling and palpitations.  Gastrointestinal:  Positive for diarrhea and nausea. Negative for abdominal distention, abdominal pain, blood in stool, constipation, rectal pain and vomiting.  Endocrine: Negative.  Negative for hot flashes.  Genitourinary: Negative.  Negative for bladder incontinence, difficulty urinating, dyspareunia, dysuria, frequency, hematuria, menstrual problem, nocturia, pelvic pain, vaginal bleeding and vaginal discharge.   Musculoskeletal:  Positive for myalgias (left axilla). Negative for arthralgias, back pain, flank pain, gait problem, neck pain and neck stiffness.  Skin: Negative.  Negative for itching, rash and wound.  Neurological:  Positive for numbness (on her leftside). Negative for dizziness, extremity weakness, gait problem, headaches, light-headedness, seizures and speech difficulty.  Hematological: Negative.  Negative for adenopathy. Does not bruise/bleed easily.  Psychiatric/Behavioral:  Negative for confusion, decreased concentration, depression, sleep disturbance and suicidal ideas. The patient is not nervous/anxious.        Decreased memory.   All other systems reviewed and are negative.   VITALS:  Blood pressure (!) 131/99, pulse 80, temperature 98 F (36.7 C), temperature source Oral, resp. rate 18, height 5\' 1"  (1.549 m), weight 189 lb 9.6 oz (86 kg), SpO2 98%.  Wt Readings from Last 3 Encounters:  05/28/23 189 lb 9.6 oz (86 kg)  06/23/22 186 lb 8 oz (84.6 kg)  06/11/22 185 lb 1.6 oz (84 kg)     Body mass index is 35.82 kg/m.  Performance status (ECOG): 1 - Symptomatic but completely ambulatory  PHYSICAL EXAM:  Physical Exam Vitals and nursing note reviewed.  Constitutional:      General: She is not in acute distress.    Appearance: Normal appearance. She is normal weight. She is not ill-appearing, toxic-appearing or diaphoretic.  HENT:     Head: Normocephalic and atraumatic.     Right Ear: Tympanic membrane, ear canal and external ear normal. There is no impacted cerumen.     Left Ear: Tympanic membrane, ear canal and external ear normal. There is no impacted cerumen.     Nose: Nose normal. No congestion or rhinorrhea.     Mouth/Throat:     Mouth: Mucous membranes are moist.     Pharynx: Oropharynx is clear. No oropharyngeal exudate or posterior oropharyngeal erythema.  Eyes:     General: No scleral icterus.       Right eye: No discharge.        Left eye: No discharge.     Extraocular Movements: Extraocular movements intact.     Conjunctiva/sclera: Conjunctivae normal.     Pupils: Pupils are equal, round, and reactive to light.  Neck:     Vascular: No  carotid bruit.  Cardiovascular:     Rate and Rhythm: Normal rate and regular rhythm.     Pulses: Normal pulses.     Heart sounds: Normal heart sounds. No murmur heard.    No friction rub. No gallop.  Pulmonary:     Effort: Pulmonary effort is normal. No respiratory distress.     Breath sounds: Normal breath sounds. No stridor. No wheezing, rhonchi or rales.  Chest:     Chest wall: No tenderness.  Breasts:    Right: Normal. No swelling, bleeding, inverted nipple, mass, nipple discharge, skin change or tenderness.     Left: Absent.     Comments: Left mastectomy is negative.   Right breast is without masses. Dark nevus 1cm in the anterior left shoulder which appears irregular in pigmentation. But this remains stable.  Abdominal:     General: Bowel sounds are normal. There is no distension.     Palpations: Abdomen  is soft. There is no hepatomegaly, splenomegaly or mass.     Tenderness: There is no abdominal tenderness. There is no right CVA tenderness, left CVA tenderness, guarding or rebound.     Hernia: No hernia is present.  Musculoskeletal:        General: No swelling, deformity or signs of injury. Normal range of motion.     Right upper arm: No tenderness.     Left upper arm: No tenderness.     Cervical back: Normal range of motion and neck supple. No rigidity or tenderness.     Right lower leg: No edema.     Left lower leg: No edema.  Lymphadenopathy:     Cervical: No cervical adenopathy.     Upper Body:     Right upper body: No supraclavicular or axillary adenopathy.     Left upper body: No supraclavicular or axillary adenopathy.     Lower Body: No right inguinal adenopathy. No left inguinal adenopathy.  Skin:    General: Skin is warm and dry.     Coloration: Skin is not jaundiced or pale.     Findings: No bruising, erythema, lesion or rash.  Neurological:     General: No focal deficit present.     Mental Status: She is alert and oriented to person, place, and time. Mental status is at baseline.     Cranial Nerves: No cranial nerve deficit.     Sensory: No sensory deficit.     Motor: No weakness.     Coordination: Coordination normal.     Gait: Gait normal.     Deep Tendon Reflexes: Reflexes normal.  Psychiatric:        Attention and Perception: Attention and perception normal.        Mood and Affect: Mood and affect normal. Mood is not anxious or depressed. Affect is not tearful.        Speech: Speech normal.        Behavior: Behavior normal.        Thought Content: Thought content normal.        Cognition and Memory: Cognition and memory normal.        Judgment: Judgment normal.    LABS:      Latest Ref Rng & Units 05/28/2023    3:42 PM 06/11/2022    9:05 AM 12/09/2021   12:00 AM  CBC  WBC 4.0 - 10.5 K/uL 7.8  7.0  8.7      Hemoglobin 12.0 - 15.0 g/dL 40.9  81.1  14.5  Hematocrit 36.0 - 46.0 % 40.0  41.5  43      Platelets 150 - 400 K/uL 458  445  433         This result is from an external source.      Latest Ref Rng & Units 05/28/2023    3:42 PM 06/11/2022    9:05 AM 12/09/2021   12:00 AM  CMP  Glucose 70 - 99 mg/dL 536  644    BUN 6 - 20 mg/dL 6  8  10       Creatinine 0.44 - 1.00 mg/dL 0.34  7.42  0.7      Sodium 135 - 145 mmol/L 141  138  138      Potassium 3.5 - 5.1 mmol/L 3.5  3.8  3.5      Chloride 98 - 111 mmol/L 104  107  109      CO2 22 - 32 mmol/L 24  24  24       Calcium 8.9 - 10.3 mg/dL 8.9  8.6  9.0      Total Protein 6.5 - 8.1 g/dL 7.0  7.3    Total Bilirubin 0.0 - 1.2 mg/dL 0.2  0.5    Alkaline Phos 38 - 126 U/L 104  83  81      AST 15 - 41 U/L 15  19  24       ALT 0 - 44 U/L 15  18  22          This result is from an external source.   STUDIES:     HISTORY:   Past Medical History:  Diagnosis Date   Breast cancer (HCC) 10/21/2012   Depression 07/31/2020    Past Surgical History:  Procedure Laterality Date   MASTECTOMY Left 05/16/2013    No family history on file.  Social History:  reports that she has been smoking cigarettes. She has a 4 pack-year smoking history. She has never used smokeless tobacco. She reports current drug use. Drug: Marijuana. No history on file for alcohol use.The patient is alone today.  Allergies:  Allergies  Allergen Reactions   Inositol Niacinate    Lovastatin Other (See Comments)    Other reaction(s): Other (See Comments)  Elevated hepatic enzymes   Niacin Itching    Current Medications: Current Outpatient Medications  Medication Sig Dispense Refill   traMADol (ULTRAM) 50 MG tablet Take by mouth.     albuterol (VENTOLIN HFA) 108 (90 Base) MCG/ACT inhaler Inhale into the lungs.     amitriptyline (ELAVIL) 50 MG tablet Take by mouth.     atorvastatin (LIPITOR) 40 MG tablet SMARTSIG:1 Tablet(s) By Mouth Every Evening     Calcium Carbonate-Vitamin D 600-5 MG-MCG TABS Take by mouth.      gabapentin (NEURONTIN) 300 MG capsule Take 1 capsule (300 mg total) by mouth 3 (three) times daily. 90 capsule 5   lisinopril (ZESTRIL) 2.5 MG tablet Take 2.5 mg by mouth daily.     metFORMIN (GLUCOPHAGE-XR) 500 MG 24 hr tablet Take by mouth.     tamoxifen (NOLVADEX) 20 MG tablet Take 1 tablet (20 mg total) by mouth daily. 90 tablet 3   No current facility-administered medications for this visit.     ASSESSMENT & PLAN:  Assessment:  1. Stage III A hormone receptor positive breast cancer treated with left mastectomy adjuvant chemotherapy and hormonal therapy with tamoxifen. She remains without evidence of recurrence. We have recommended a total of 10 years of tamoxifen.  Unfortunately,  she has been on and off the tamoxifen for years. We may need to consider stopping it with the risk of thromboembolic events and her current symptoms.  2. Tobacco abuse, once again I advised her of the importance of complete abstinence from tobacco.    3.  Black nevus of the left anterior upper chest which appears to be changing and needs to be evaluated. We have referred her to the dermatologist in the past.   4.  Persistent neuropathy.   5. Pain of the neck radiating down the left arm with possible weakness of the left arm. Her cervical spine x-ray showed severe degenerative disk disease especially in the C5, C6, and C7 that has pressure on the nerves of both sides. It also revealed osteophytes in her neck. I think this is the explanation for her neck and left arm pain.  6. Viral Gastroenteritis. I agree this is consistent with a viral syndrome.   Plan: She continues Tamoxifen without difficulty. She will be due for her annual screening bilateral mammogram in February. She has a WBC of 7.8, hemoglobin of 13.8, and platelet count of 458,000. Her CMP is normal other than a barely normal potassium of 3.5. I recommended bananas and fluids to help with her potassium levels and current diarrhea. I will see her  back in 1 year with CBC, CMP, right mammogram. The patient understands the plans discussed today and is in agreement with them.  She knows to contact our office if she develops concerns prior to her next appointment.  I provided 15 minutes of face-to-face time during this this encounter and > 50% was spent counseling as documented under my assessment and plan.   Dellia Beckwith, MD  [facility] - [facility] DEPT OF [facility] [address] [place] [address] Dept: [phone] Dept Fax: [phone]   No orders of the defined types were placed in this encounter.  I,Jasmine M Lassiter,acting as a scribe for Dellia Beckwith, MD.,have documented all relevant documentation on the behalf of Dellia Beckwith, MD,as directed by  Dellia Beckwith, MD while in the presence of Dellia Beckwith, MD.

## 2023-06-03 ENCOUNTER — Telehealth: Payer: Self-pay | Admitting: Oncology

## 2023-06-03 NOTE — Telephone Encounter (Signed)
Contacted pt and provided her with Imaging Depts phone number since they have been unable to reach her via phone to schedule her Screening Mammogram.    Unable to reach via phone but VM was left for pt.

## 2023-06-05 ENCOUNTER — Encounter: Payer: Self-pay | Admitting: Oncology

## 2024-06-24 ENCOUNTER — Ambulatory Visit: Payer: Medicaid Other | Admitting: Oncology

## 2024-06-24 ENCOUNTER — Other Ambulatory Visit: Payer: Medicaid Other
